# Patient Record
Sex: Female | Born: 1985
Health system: Southern US, Community
[De-identification: ages and names within clinical notes are randomized; demographics above are authoritative.]

## PROBLEM LIST (undated history)

## (undated) DIAGNOSIS — J45909 Unspecified asthma, uncomplicated: Secondary | ICD-10-CM

---
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
## 2003-10-31 ENCOUNTER — Emergency Department (HOSPITAL_COMMUNITY): Admission: EM | Admit: 2003-10-31 | Discharge: 2003-10-31 | Payer: Self-pay | Admitting: Emergency Medicine

## 2003-11-05 ENCOUNTER — Emergency Department (HOSPITAL_COMMUNITY): Admission: EM | Admit: 2003-11-05 | Discharge: 2003-11-05 | Payer: Self-pay | Admitting: Emergency Medicine

## 2005-05-03 ENCOUNTER — Emergency Department (HOSPITAL_COMMUNITY): Admission: EM | Admit: 2005-05-03 | Discharge: 2005-05-03 | Payer: Self-pay | Admitting: Family Medicine

## 2007-07-22 ENCOUNTER — Emergency Department (HOSPITAL_COMMUNITY): Admission: EM | Admit: 2007-07-22 | Discharge: 2007-07-22 | Payer: Self-pay | Admitting: Emergency Medicine

## 2007-09-22 ENCOUNTER — Emergency Department (HOSPITAL_COMMUNITY): Admission: EM | Admit: 2007-09-22 | Discharge: 2007-09-22 | Payer: Self-pay | Admitting: Emergency Medicine

## 2007-09-23 ENCOUNTER — Emergency Department (HOSPITAL_COMMUNITY): Admission: EM | Admit: 2007-09-23 | Discharge: 2007-09-23 | Payer: Self-pay | Admitting: Emergency Medicine

## 2008-11-19 ENCOUNTER — Emergency Department (HOSPITAL_COMMUNITY): Admission: EM | Admit: 2008-11-19 | Discharge: 2008-11-19 | Payer: Self-pay | Admitting: Emergency Medicine

## 2009-01-26 ENCOUNTER — Emergency Department (HOSPITAL_COMMUNITY): Admission: EM | Admit: 2009-01-26 | Discharge: 2009-01-26 | Payer: Self-pay | Admitting: Emergency Medicine

## 2010-05-02 LAB — POCT URINALYSIS DIP (DEVICE)
Glucose, UA: NEGATIVE mg/dL
Ketones, ur: NEGATIVE mg/dL
Nitrite: NEGATIVE
Protein, ur: 30 mg/dL — AB
Specific Gravity, Urine: 1.015 (ref 1.005–1.030)
Urobilinogen, UA: 1 mg/dL (ref 0.0–1.0)
pH: 5.5 (ref 5.0–8.0)

## 2010-05-02 LAB — GC/CHLAMYDIA PROBE AMP, GENITAL
Chlamydia, DNA Probe: NEGATIVE
GC Probe Amp, Genital: NEGATIVE

## 2010-05-02 LAB — HERPES SIMPLEX VIRUS CULTURE: Culture: NOT DETECTED

## 2010-05-02 LAB — WET PREP, GENITAL
Trich, Wet Prep: NONE SEEN
Yeast Wet Prep HPF POC: NONE SEEN

## 2010-05-02 LAB — POCT PREGNANCY, URINE: Preg Test, Ur: NEGATIVE

## 2010-05-05 LAB — URINALYSIS, ROUTINE W REFLEX MICROSCOPIC
Glucose, UA: NEGATIVE mg/dL
Nitrite: NEGATIVE
Protein, ur: NEGATIVE mg/dL
Urobilinogen, UA: 1 mg/dL (ref 0.0–1.0)

## 2010-05-05 LAB — PREGNANCY, URINE: Preg Test, Ur: NEGATIVE

## 2010-08-18 ENCOUNTER — Inpatient Hospital Stay (INDEPENDENT_AMBULATORY_CARE_PROVIDER_SITE_OTHER)
Admission: RE | Admit: 2010-08-18 | Discharge: 2010-08-18 | Disposition: A | Discharge status: Home / Self Care | Payer: PRIVATE HEALTH INSURANCE | Source: Ambulatory Visit | Attending: Emergency Medicine | Admitting: Emergency Medicine

## 2010-08-18 DIAGNOSIS — N12 Tubulo-interstitial nephritis, not specified as acute or chronic: Secondary | ICD-10-CM

## 2010-08-18 LAB — POCT URINALYSIS DIP (DEVICE)
Nitrite: POSITIVE — AB
Protein, ur: NEGATIVE mg/dL
Urobilinogen, UA: 0.2 mg/dL (ref 0.0–1.0)

## 2010-08-18 LAB — POCT PREGNANCY, URINE: Preg Test, Ur: NEGATIVE

## 2010-08-20 LAB — URINE CULTURE

## 2010-11-07 ENCOUNTER — Inpatient Hospital Stay (INDEPENDENT_AMBULATORY_CARE_PROVIDER_SITE_OTHER)
Admission: RE | Admit: 2010-11-07 | Discharge: 2010-11-07 | Disposition: A | Discharge status: Home / Self Care | Payer: PRIVATE HEALTH INSURANCE | Source: Ambulatory Visit | Attending: Family Medicine | Admitting: Family Medicine

## 2010-11-07 DIAGNOSIS — N390 Urinary tract infection, site not specified: Secondary | ICD-10-CM

## 2010-11-07 LAB — POCT URINALYSIS DIP (DEVICE)
Glucose, UA: NEGATIVE mg/dL
Ketones, ur: NEGATIVE mg/dL
Specific Gravity, Urine: 1.015 (ref 1.005–1.030)
Urobilinogen, UA: 0.2 mg/dL (ref 0.0–1.0)

## 2010-11-07 LAB — POCT PREGNANCY, URINE: Preg Test, Ur: NEGATIVE

## 2012-06-11 ENCOUNTER — Emergency Department (HOSPITAL_COMMUNITY)
Admission: EM | Admit: 2012-06-11 | Discharge: 2012-06-11 | Disposition: A | Discharge status: Home / Self Care | Payer: Commercial Managed Care - PPO | Attending: Emergency Medicine | Admitting: Emergency Medicine

## 2012-06-11 ENCOUNTER — Encounter (HOSPITAL_COMMUNITY): Payer: Self-pay | Admitting: *Deleted

## 2012-06-11 DIAGNOSIS — R0602 Shortness of breath: Secondary | ICD-10-CM | POA: Insufficient documentation

## 2012-06-11 DIAGNOSIS — Z79899 Other long term (current) drug therapy: Secondary | ICD-10-CM | POA: Insufficient documentation

## 2012-06-11 DIAGNOSIS — R05 Cough: Secondary | ICD-10-CM | POA: Insufficient documentation

## 2012-06-11 DIAGNOSIS — J45901 Unspecified asthma with (acute) exacerbation: Secondary | ICD-10-CM

## 2012-06-11 DIAGNOSIS — R059 Cough, unspecified: Secondary | ICD-10-CM | POA: Insufficient documentation

## 2012-06-11 DIAGNOSIS — R0789 Other chest pain: Secondary | ICD-10-CM | POA: Insufficient documentation

## 2012-06-11 HISTORY — DX: Unspecified asthma, uncomplicated: J45.909

## 2012-06-11 MED ORDER — ALBUTEROL SULFATE (5 MG/ML) 0.5% IN NEBU
5.0000 mg | INHALATION_SOLUTION | Freq: Once | RESPIRATORY_TRACT | Status: AC
  Administered 2012-06-11: 5 mg via RESPIRATORY_TRACT
  Filled 2012-06-11: qty 1

## 2012-06-11 NOTE — ED Notes (Signed)
Pt c/o SOB x's 1 week. Reports "my asthma is just been acting up, I've been using my albuterol too much." pt denies complaints of pain.

## 2012-06-11 NOTE — ED Provider Notes (Signed)
History    This chart was scribed for non-physician practitioner Johnnette Gourd working with Celene Kras, MD by Quintella Reichert, ED Scribe. This patient was seen in room WTR6/WTR6 and the patient's care was started at 6:18 PM .   CSN: [id number]  Arrival date & time 06/11/12  1729      Chief Complaint  Patient presents with  . Asthma     The history is provided by the patient. No language interpreter was used.    HPI Comments: Allison Crosby is a 27 y.o. female who presents to the Emergency Department complaining of gradual onset, gradually worsening, constant asthma exacerbation that began last week.  Pt states that her symptoms include chest tightness, wheezing, SOB, and cough.  She states that SOB occurs mainly at night, and describes cough as usually dry but intermittently phlegmatic today. Pt states that current symptoms feel like typical asthma exacerbation for her.  She has an albuterol inhaler at home and has been using it several times per day, which has not relieved symptoms. At baseline, she denies needing to use her inhaler daily. Pt has allergies and has not taken allergy medications.   Past Medical History  Diagnosis Date  . Asthma     History reviewed. No pertinent past surgical history.  History reviewed. No pertinent family history.  History  Substance Use Topics  . Smoking status: Not on file  . Smokeless tobacco: Not on file  . Alcohol Use: Yes    No OB history provided.  Review of Systems  Constitutional: Negative for fever and chills.  Respiratory: Positive for cough, chest tightness, shortness of breath and wheezing.   All other systems reviewed and are negative.    Allergies  Metronidazole  Home Medications   Current Outpatient Rx  Name  Route  Sig  Dispense  Refill  . albuterol (PROVENTIL HFA;VENTOLIN HFA) 108 (90 BASE) MCG/ACT inhaler   Inhalation   Inhale 2 puffs into the lungs every 6 (six) hours as needed for wheezing.          . Aspirin-Acetaminophen-Caffeine (GOODY HEADACHE PO)   Oral   Take 1 Package by mouth 2 (two) times daily as needed (pain, headache).           BP 147/73  Pulse 75  Temp(Src) 98.6 F (37 C) (Oral)  Resp 18  Ht 5\' 5"  (1.651 m)  Wt 270 lb (122.471 kg)  BMI 44.93 kg/m2  SpO2 98%  LMP 06/09/2012  Physical Exam  Nursing note and vitals reviewed. Constitutional: She is oriented to person, place, and time. She appears well-developed and well-nourished. No distress.  HENT:  Head: Normocephalic and atraumatic.  Congestion and mucosal edema  Eyes: Conjunctivae and EOM are normal.  Neck: Normal range of motion. Neck supple.  Cardiovascular: Normal rate, regular rhythm and normal heart sounds.   Pulmonary/Chest: Effort normal. No respiratory distress. She has wheezes. She has no rales.  Mild scattered inspiratory and expiratory wheezing. No rales or rhonchi.   Musculoskeletal: Normal range of motion. She exhibits no edema.  Neurological: She is alert and oriented to person, place, and time. No sensory deficit.  Skin: Skin is warm and dry.  Psychiatric: She has a normal mood and affect. Her behavior is normal.    ED Course  Procedures (including critical care time)  DIAGNOSTIC STUDIES: Oxygen Saturation is 98% on room air, normal by my interpretation.    COORDINATION OF CARE: 6:20 PM-Discussed treatment plan which includes breathing treatment with  pt at bedside and pt agreed to plan. Advised pt that her symptoms are not severe enough to warrant steroids at this time and pt agreed.    Labs Reviewed - No data to display No results found.   1. Asthma exacerbation       MDM  27 year old female with asthma exacerbation. Very mild wheezing on exam. I gave patient albuterol breathing treatment, and on reexamination her lungs are clear. Patient reports marked improvement and states she's feeling a lot better. I advised her to take an allergy pill daily. Resource guide given for  PCP followup. Return precautions discussed. Patient states understanding of plan and is agreeable.    I personally performed the services described in this documentation, which was scribed in my presence. The recorded information has been reviewed and is accurate.   Trevor Mace, PA-C 06/11/12 [phone]

## 2012-06-12 NOTE — ED Provider Notes (Signed)
Medical screening examination/treatment/procedure(s) were performed by non-physician practitioner and as supervising physician I was immediately available for consultation/collaboration.   Celene Kras, MD 06/12/12 [phone]

## 2012-06-16 ENCOUNTER — Emergency Department (HOSPITAL_COMMUNITY)
Admission: EM | Admit: 2012-06-16 | Discharge: 2012-06-16 | Disposition: A | Discharge status: Home / Self Care | Payer: Self-pay | Attending: Emergency Medicine | Admitting: Emergency Medicine

## 2012-06-16 ENCOUNTER — Emergency Department (HOSPITAL_COMMUNITY): Payer: Self-pay

## 2012-06-16 ENCOUNTER — Encounter (HOSPITAL_COMMUNITY): Payer: Self-pay | Admitting: *Deleted

## 2012-06-16 DIAGNOSIS — Z79899 Other long term (current) drug therapy: Secondary | ICD-10-CM | POA: Insufficient documentation

## 2012-06-16 DIAGNOSIS — R0609 Other forms of dyspnea: Secondary | ICD-10-CM | POA: Insufficient documentation

## 2012-06-16 DIAGNOSIS — J3489 Other specified disorders of nose and nasal sinuses: Secondary | ICD-10-CM | POA: Insufficient documentation

## 2012-06-16 DIAGNOSIS — J988 Other specified respiratory disorders: Secondary | ICD-10-CM

## 2012-06-16 DIAGNOSIS — R0600 Dyspnea, unspecified: Secondary | ICD-10-CM

## 2012-06-16 DIAGNOSIS — R0989 Other specified symptoms and signs involving the circulatory and respiratory systems: Secondary | ICD-10-CM | POA: Insufficient documentation

## 2012-06-16 LAB — BASIC METABOLIC PANEL
BUN: 10 mg/dL (ref 6–23)
CO2: 29 mEq/L (ref 19–32)
Calcium: 9 mg/dL (ref 8.4–10.5)
Chloride: 102 mEq/L (ref 96–112)
Creatinine, Ser: 0.76 mg/dL (ref 0.50–1.10)
GFR calc Af Amer: >90 mL/min (ref >90)
GFR calc non Af Amer: >90 mL/min (ref >90)
Glucose, Bld: 90 mg/dL (ref 70–99)
Potassium: 3.7 mEq/L (ref 3.5–5.1)
Sodium: 136 mEq/L (ref 135–145)

## 2012-06-16 LAB — CBC WITH DIFFERENTIAL/PLATELET
Basophils Absolute: 0 10*3/uL (ref 0.0–0.1)
Basophils Relative: 1 % (ref 0–1)
Eosinophils Absolute: 0.6 10*3/uL (ref 0.0–0.7)
Eosinophils Relative: 9 % — ABNORMAL HIGH (ref 0–5)
HCT: 37 % (ref 36.0–46.0)
Hemoglobin: 12.6 g/dL (ref 12.0–15.0)
Lymphocytes Relative: 31 % (ref 12–46)
Lymphs Abs: 2.1 10*3/uL (ref 0.7–4.0)
MCH: 28.4 pg (ref 26.0–34.0)
MCHC: 34.1 g/dL (ref 30.0–36.0)
MCV: 83.3 fL (ref 78.0–100.0)
Monocytes Absolute: 0.5 10*3/uL (ref 0.1–1.0)
Monocytes Relative: 7 % (ref 3–12)
Neutro Abs: 3.5 10*3/uL (ref 1.7–7.7)
Neutrophils Relative %: 52 % (ref 43–77)
Platelets: 387 10*3/uL (ref 150–400)
RBC: 4.44 MIL/uL (ref 3.87–5.11)
RDW: 12.6 % (ref 11.5–15.5)
WBC: 6.7 10*3/uL (ref 4.0–10.5)

## 2012-06-16 LAB — D-DIMER, QUANTITATIVE: D-Dimer, Quant: 0.37 ug/mL-FEU (ref 0.00–0.48)

## 2012-06-16 MED ORDER — FEXOFENADINE-PSEUDOEPHED ER 60-120 MG PO TB12: 1.0000 | ORAL_TABLET | Freq: Two times a day (BID) | ORAL | Status: DC

## 2012-06-16 NOTE — ED Notes (Signed)
MD at bedside. 

## 2012-06-16 NOTE — ED Notes (Signed)
Patient states that she was awakened by "not being able to breathe well about 3am.

## 2012-06-16 NOTE — ED Notes (Signed)
Pt from home with reports of shortness of breath that is worse at night. Pt reports being treated for same here on Tuesday. Pt endorses hx of asthma with increasing use of inhaler that does not resolve shortness of breath and cough.

## 2012-06-18 NOTE — ED Provider Notes (Signed)
History    27yF with sob. Onset almost a week ago. Worse at night. Fairly constant. using inhaler but doesn't seem to help much. Denies cp. No fever. Nonproductive cough. No usual leg pain or swelling. Seen in ED recently for same. Reports symptoms most unchanged.   CSN: [id number]  Arrival date & time 06/16/12  7829   First MD Initiated Contact with Patient 06/16/12 [phone]      Chief Complaint  Patient presents with  . Shortness of Breath    (Consider location/radiation/quality/duration/timing/severity/associated sxs/prior treatment) HPI  Past Medical History  Diagnosis Date  . Asthma     History reviewed. No pertinent past surgical history.  History reviewed. No pertinent family history.  History  Substance Use Topics  . Smoking status: Not on file  . Smokeless tobacco: Never Used  . Alcohol Use: Yes    OB History   Grav Para Term Preterm Abortions TAB SAB Ect Mult Living                  Review of Systems  All systems reviewed and negative, other than as noted in HPI.   Allergies  Metronidazole  Home Medications   Current Outpatient Rx  Name  Route  Sig  Dispense  Refill  . albuterol (PROVENTIL HFA;VENTOLIN HFA) 108 (90 BASE) MCG/ACT inhaler   Inhalation   Inhale 2 puffs into the lungs every 6 (six) hours as needed for wheezing.         . Aspirin-Acetaminophen-Caffeine (GOODY HEADACHE PO)   Oral   Take 1 Package by mouth 2 (two) times daily as needed (pain, headache).         . fexofenadine (ALLEGRA) 180 MG tablet   Oral   Take 180 mg by mouth daily.         Marland Kitchen levonorgestrel (MIRENA) 20 MCG/24HR IUD   Intrauterine   1 each by Intrauterine route once.         . fexofenadine-pseudoephedrine (ALLEGRA-D) 60-120 MG per tablet   Oral   Take 1 tablet by mouth every 12 (twelve) hours.   30 tablet   0     BP 120/74  Pulse 64  Temp(Src) 98.2 F (36.8 C) (Oral)  Resp 16  SpO2 98%  LMP 06/09/2012  Physical Exam  Nursing note and vitals  reviewed. Constitutional: She appears well-developed and well-nourished. No distress.  HENT:  Head: Normocephalic and atraumatic.  Eyes: Conjunctivae are normal. Right eye exhibits no discharge. Left eye exhibits no discharge.  Neck: Neck supple.  Cardiovascular: Normal rate, regular rhythm and normal heart sounds.  Exam reveals no gallop and no friction rub.   No murmur heard. Pulmonary/Chest: Effort normal and breath sounds normal. No respiratory distress.  Abdominal: Soft. She exhibits no distension. There is no tenderness.  Musculoskeletal: She exhibits no edema and no tenderness.  Lower extremities symmetric as compared to each other. No calf tenderness. Negative Homan's. No palpable cords.   Neurological: She is alert.  Skin: Skin is warm and dry.  Psychiatric: She has a normal mood and affect. Her behavior is normal. Thought content normal.    ED Course  Procedures (including critical care time)  Labs Reviewed  CBC WITH DIFFERENTIAL - Abnormal; Notable for the following:    Eosinophils Relative 9 (*)    All other components within normal limits  BASIC METABOLIC PANEL  D-DIMER, QUANTITATIVE   No results found.   1. Dyspnea   2. Congestion of upper airway  MDM  27yF with dyspnea and congestion.  Exam unremarkable. CXR clear. Mirena, otherwise low risk for PE. Dimer normal. Already taking allerga. Decongestant added. Return precautions discussed.         Raeford Razor, MD 06/18/12 [phone]

## 2012-09-09 ENCOUNTER — Encounter: Payer: Self-pay | Admitting: Obstetrics & Gynecology

## 2012-09-23 ENCOUNTER — Encounter: Payer: Self-pay | Admitting: *Deleted

## 2012-10-16 ENCOUNTER — Encounter: Payer: Self-pay | Admitting: Obstetrics & Gynecology

## 2012-10-26 ENCOUNTER — Emergency Department (INDEPENDENT_AMBULATORY_CARE_PROVIDER_SITE_OTHER)
Admission: EM | Admit: 2012-10-26 | Discharge: 2012-10-26 | Disposition: A | Discharge status: Home / Self Care | Payer: Self-pay | Source: Home / Self Care

## 2012-10-26 ENCOUNTER — Encounter (HOSPITAL_COMMUNITY): Payer: Self-pay | Admitting: Emergency Medicine

## 2012-10-26 ENCOUNTER — Other Ambulatory Visit (HOSPITAL_COMMUNITY)
Admission: RE | Admit: 2012-10-26 | Discharge: 2012-10-26 | Disposition: A | Discharge status: Home / Self Care | Payer: PRIVATE HEALTH INSURANCE | Source: Ambulatory Visit | Attending: Emergency Medicine | Admitting: Emergency Medicine

## 2012-10-26 DIAGNOSIS — N898 Other specified noninflammatory disorders of vagina: Secondary | ICD-10-CM

## 2012-10-26 DIAGNOSIS — N760 Acute vaginitis: Secondary | ICD-10-CM | POA: Insufficient documentation

## 2012-10-26 DIAGNOSIS — N899 Noninflammatory disorder of vagina, unspecified: Secondary | ICD-10-CM

## 2012-10-26 DIAGNOSIS — Z113 Encounter for screening for infections with a predominantly sexual mode of transmission: Secondary | ICD-10-CM | POA: Insufficient documentation

## 2012-10-26 LAB — POCT URINALYSIS DIP (DEVICE)
Bilirubin Urine: NEGATIVE
Glucose, UA: NEGATIVE mg/dL
Ketones, ur: NEGATIVE mg/dL
Leukocytes, UA: NEGATIVE
Protein, ur: NEGATIVE mg/dL
Specific Gravity, Urine: 1.02 (ref 1.005–1.030)

## 2012-10-26 LAB — POCT PREGNANCY, URINE: Preg Test, Ur: NEGATIVE

## 2012-10-26 NOTE — ED Provider Notes (Signed)
CSN: [id number]     Arrival date & time 10/26/12  0900 History   None    Chief Complaint  Patient presents with  . Vaginal Discharge   (Consider location/radiation/quality/duration/timing/severity/associated sxs/prior Treatment) HPI  Presenting to [facility] for vaginal discharge. Started Monday. Foul odor. No discomfort. White discharge. Has IUD. Sexually active but no new partners. Denies recent ABX. Deneis fevers, n/v/d/c lower abdominal pain, dysuria, dysparunia.   Pt concerned for BV. Has has ~2x this year.   Past Medical History  Diagnosis Date  . Asthma    History reviewed. No pertinent past surgical history. No family history on file. History  Substance Use Topics  . Smoking status: Never Smoker   . Smokeless tobacco: Never Used  . Alcohol Use: Yes   OB History   Grav Para Term Preterm Abortions TAB SAB Ect Mult Living                 Review of Systems  Constitutional: Negative for fever, activity change and appetite change.  Gastrointestinal: Negative for nausea, vomiting, abdominal pain, constipation and blood in stool.  Genitourinary: Negative for dysuria and dyspareunia.  All other systems reviewed and are negative.    Allergies  Metronidazole  Home Medications   Current Outpatient Rx  Name  Route  Sig  Dispense  Refill  . albuterol (PROVENTIL HFA;VENTOLIN HFA) 108 (90 BASE) MCG/ACT inhaler   Inhalation   Inhale 2 puffs into the lungs every 6 (six) hours as needed for wheezing.         . Aspirin-Acetaminophen-Caffeine (GOODY HEADACHE PO)   Oral   Take 1 Package by mouth 2 (two) times daily as needed (pain, headache).         . fexofenadine (ALLEGRA) 180 MG tablet   Oral   Take 180 mg by mouth daily.         . fexofenadine-pseudoephedrine (ALLEGRA-D) 60-120 MG per tablet   Oral   Take 1 tablet by mouth every 12 (twelve) hours.   30 tablet   0   . levonorgestrel (MIRENA) 20 MCG/24HR IUD   Intrauterine   1 each by Intrauterine route once.           BP 115/76  Pulse 70  Temp(Src) 98.4 F (36.9 C) (Oral)  Resp 70  SpO2 96%  LMP 10/03/2012 Physical Exam  Constitutional: She is oriented to person, place, and time. She appears well-developed and well-nourished. No distress.  HENT:  Head: Normocephalic.  Eyes: EOM are normal. Pupils are equal, round, and reactive to light.  Neck: Normal range of motion.  Pulmonary/Chest: Effort normal. No respiratory distress.  Abdominal: Soft. She exhibits no distension.  Genitourinary: Uterus normal. Vaginal discharge found.  Musculoskeletal: Normal range of motion. She exhibits no edema.  Neurological: She is alert and oriented to person, place, and time.  Skin: Skin is warm and dry. No rash noted. No erythema.  Psychiatric: She has a normal mood and affect. Her behavior is normal. Judgment and thought content normal.    ED Course  Procedures (including critical care time) Labs Review Labs Reviewed  POCT URINALYSIS DIP (DEVICE)  POCT PREGNANCY, URINE   Imaging Review No results found.  MDM   1. Vaginal discharge   2. Vaginal irritation    27yo F w/ vaginal discharge concerning for BV given recent h/o BV infections. Wet Prep Gc, Chl sent  - Motrin 600 prn - f/u lab results and will Rx as needed. Of note [facility] allergy so will  need. Clinda if + for BV  Shelly Flatten, MD Family Medicine PGY-3 10/26/2012, 9:35 AM      Ozella Rocks, MD 10/26/12 [phone]

## 2012-10-26 NOTE — ED Notes (Signed)
Pt c/o a white/pinkish vag d/c onset Monday Sxs include: foul odor Denies: abd pain, dysuria, hematuria, f/v/n/d She is alert w/no signs of acute distress.

## 2012-10-26 NOTE — ED Provider Notes (Signed)
Medical screening examination/treatment/procedure(s) were performed by a resident physician and as supervising physician I was immediately available for consultation/collaboration.  Leslee Home, M.D.  Reuben Likes, MD 10/26/12 1047

## 2012-10-29 MED ORDER — CLINDAMYCIN HCL 300 MG PO CAPS: 300.0000 mg | ORAL_CAPSULE | Freq: Two times a day (BID) | ORAL | Status: DC

## 2012-10-29 NOTE — Progress Notes (Signed)
BV noted on Wet prep. Called in Clinda 300mg  BID x7 days ([facility] allergy) Pt called to inform that Rx wqaiting at pharmacy.  Shelly Flatten, MD Family Medicine PGY-3 10/29/2012, 12:51 PM

## 2012-10-31 NOTE — ED Notes (Signed)
GC/Chlamydia neg., Affirm: Candida and Trich neg., Gardnerella pos.  Pt. adequately treated with Clindamycin. [name] 10/31/2012

## 2016-03-13 LAB — HM PAP SMEAR

## 2016-04-17 ENCOUNTER — Emergency Department (HOSPITAL_COMMUNITY): Payer: No Typology Code available for payment source

## 2016-04-17 ENCOUNTER — Emergency Department (HOSPITAL_COMMUNITY)
Admission: EM | Admit: 2016-04-17 | Discharge: 2016-04-17 | Disposition: A | Discharge status: Home / Self Care | Payer: No Typology Code available for payment source | Attending: Emergency Medicine | Admitting: Emergency Medicine

## 2016-04-17 ENCOUNTER — Encounter (HOSPITAL_COMMUNITY): Payer: Self-pay | Admitting: Emergency Medicine

## 2016-04-17 DIAGNOSIS — Y999 Unspecified external cause status: Secondary | ICD-10-CM | POA: Diagnosis not present

## 2016-04-17 DIAGNOSIS — Z7982 Long term (current) use of aspirin: Secondary | ICD-10-CM | POA: Diagnosis not present

## 2016-04-17 DIAGNOSIS — J45909 Unspecified asthma, uncomplicated: Secondary | ICD-10-CM | POA: Insufficient documentation

## 2016-04-17 DIAGNOSIS — Y939 Activity, unspecified: Secondary | ICD-10-CM | POA: Diagnosis not present

## 2016-04-17 DIAGNOSIS — Y92410 Unspecified street and highway as the place of occurrence of the external cause: Secondary | ICD-10-CM | POA: Diagnosis not present

## 2016-04-17 DIAGNOSIS — S161XXA Strain of muscle, fascia and tendon at neck level, initial encounter: Secondary | ICD-10-CM

## 2016-04-17 DIAGNOSIS — S199XXA Unspecified injury of neck, initial encounter: Secondary | ICD-10-CM | POA: Diagnosis present

## 2016-04-17 MED ORDER — CYCLOBENZAPRINE HCL 10 MG PO TABS: 10.0000 mg | ORAL_TABLET | Freq: Two times a day (BID) | ORAL | 0 refills | Status: DC | PRN
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
## 2016-04-17 NOTE — ED Provider Notes (Signed)
Sign out from Burna FortsJeff Hedges, PA-C  [facility]. Pending cervical x-ray, d/c patient home. Low suspicion for fracture per Trey PaulaJeff.   Cervical x-ray shows no evidence of fracture or subluxation along the cervical spine. Discharge patient home with Flexeril with follow-up to PCP. Patient vitals stable and discharged in satisfactory condition.   Emi Holeslexandra M Vanity Larsson, PA-C 04/17/16 2009    Doug SouSam Jacubowitz, MD 04/17/16 2350

## 2016-04-17 NOTE — Discharge Instructions (Signed)
Please read attached information. If you experience any new or worsening signs or symptoms please return to the emergency room for evaluation. Please follow-up with your primary care provider or specialist as discussed. Please use medication prescribed only as directed and discontinue taking if you have any concerning signs or symptoms.   °

## 2016-06-05 ENCOUNTER — Ambulatory Visit (HOSPITAL_COMMUNITY)
Admission: EM | Admit: 2016-06-05 | Discharge: 2016-06-05 | Disposition: A | Discharge status: Home / Self Care | Payer: PRIVATE HEALTH INSURANCE | Attending: Internal Medicine | Admitting: Internal Medicine

## 2016-06-05 ENCOUNTER — Encounter (HOSPITAL_COMMUNITY): Payer: Self-pay | Admitting: Emergency Medicine

## 2016-06-05 DIAGNOSIS — J4541 Moderate persistent asthma with (acute) exacerbation: Secondary | ICD-10-CM | POA: Diagnosis not present

## 2016-06-05 DIAGNOSIS — J45901 Unspecified asthma with (acute) exacerbation: Secondary | ICD-10-CM

## 2016-06-05 MED ORDER — FEXOFENADINE-PSEUDOEPHED ER 60-120 MG PO TB12
1.0000 | ORAL_TABLET | Freq: Two times a day (BID) | ORAL | 0 refills | Status: DC
Start: 2016-06-05 — End: 2016-08-21

## 2016-06-05 MED ORDER — PREDNISONE 10 MG PO TABS: ORAL_TABLET | ORAL | 0 refills | Status: DC

## 2016-06-05 NOTE — ED Provider Notes (Signed)
CSN: [id number]     Arrival date & time 06/05/16  1121 History   None    Chief Complaint  Patient presents with  . Shortness of Breath   (Consider location/radiation/quality/duration/timing/severity/associated sxs/prior Treatment) The history is provided by the patient. No language interpreter was used.  Shortness of Breath  Severity:  Moderate Onset quality:  Gradual Duration:  1 day Timing:  Constant Progression:  Unchanged Chronicity:  New Context: not activity   Relieved by:  Nothing Worsened by:  Nothing Ineffective treatments:  None tried Associated symptoms: no wheezing     Past Medical History:  Diagnosis Date  . Asthma    History reviewed. No pertinent surgical history. History reviewed. No pertinent family history. Social History  Substance Use Topics  . Smoking status: Never Smoker  . Smokeless tobacco: Never Used  . Alcohol use Yes   OB History    No data available     Review of Systems  Respiratory: Positive for shortness of breath. Negative for wheezing.   All other systems reviewed and are negative.   Allergies  Metronidazole  Home Medications   Prior to Admission medications   Medication Sig Start Date End Date Taking? Authorizing Provider  albuterol (PROVENTIL HFA;VENTOLIN HFA) 108 (90 BASE) MCG/ACT inhaler Inhale 2 puffs into the lungs every 6 (six) hours as needed for wheezing.   Yes [provider]  Aspirin-Acetaminophen-Caffeine (GOODY HEADACHE PO) Take 1 Package by mouth 2 (two) times daily as needed (pain, headache).    [provider]  clindamycin (CLEOCIN) 300 MG capsule Take 1 capsule (300 mg total) by mouth 2 (two) times daily. 10/29/12   Ozella RocksMerrell, David J, MD  cyclobenzaprine (FLEXERIL) 10 MG tablet Take 1 tablet (10 mg total) by mouth 2 (two) times daily as needed for muscle spasms. 04/17/16   Hedges, Tinnie GensJeffrey, PA-C  fexofenadine-pseudoephedrine (ALLEGRA-D) 60-120 MG 12 hr tablet Take 1 tablet by mouth every 12 (twelve)  hours. 06/05/16   Elson AreasSofia, Jalayiah Bibian K, PA-C  levonorgestrel (MIRENA) 20 MCG/24HR IUD 1 each by Intrauterine route once.    [provider]  predniSONE (DELTASONE) 10 MG tablet 6,5,4,3,2,1 taper 06/05/16   Elson AreasSofia, Zakyia Gagan K, PA-C   Meds Ordered and Administered this Visit  Medications - No data to display  BP 116/76 (BP Location: Right Arm)   Pulse 69   Temp 98.5 F (36.9 C) (Oral)   Resp 20   LMP 05/24/2016   SpO2 97%  No data found.   Physical Exam  Constitutional: She is oriented to person, place, and time. She appears well-developed and well-nourished.  HENT:  Head: Normocephalic.  Right Ear: External ear normal.  Left Ear: External ear normal.  Eyes: EOM are normal.  Neck: Normal range of motion.  Cardiovascular: Normal rate and regular rhythm.   Pulmonary/Chest: Effort normal. She has no wheezes.  Abdominal: She exhibits no distension.  Musculoskeletal: Normal range of motion.  Neurological: She is alert and oriented to person, place, and time.  Psychiatric: She has a normal mood and affect.  Nursing note and vitals reviewed.   Urgent Care Course     Procedures (including critical care time)  Labs Review Labs Reviewed - No data to display  Imaging Review No results found.   Visual Acuity Review  Right Eye Distance:   Left Eye Distance:   Bilateral Distance:    Right Eye Near:   Left Eye Near:    Bilateral Near:         MDM  1. Moderate persistent asthma with exacerbation    An After Visit Summary was printed and given to the patient. Meds ordered this encounter  Medications  . fexofenadine-pseudoephedrine (ALLEGRA-D) 60-120 MG 12 hr tablet    Sig: Take 1 tablet by mouth every 12 (twelve) hours.    Dispense:  30 tablet    Refill:  0    Order Specific Question:   Supervising Provider    Answer:   Eustace Moore [[id number]]  . predniSONE (DELTASONE) 10 MG tablet    Sig: 6,5,4,3,2,1 taper    Dispense:  21 tablet    Refill:  0    Order  Specific Question:   Supervising Provider    Answer:   Eustace Moore [[id number]]      Elson Areas, PA-C 06/05/16 1332

## 2016-06-05 NOTE — ED Triage Notes (Signed)
Pt here for SOB onset yest associated w/allergy sx... Hx of asthma  Using her Proventil w/temp relief.   A&O x4... NAD

## 2016-06-05 NOTE — Discharge Instructions (Signed)
Return if any problems.

## 2016-08-09 ENCOUNTER — Telehealth: Payer: Self-pay | Admitting: Family Medicine

## 2016-08-09 NOTE — Telephone Encounter (Signed)
Relation to WJ:XBJYpt:self Call back [phone]   Reason for call:  Patient scheduled new patient appointment for 08/21/16 and would like her annual pap conducted, please advise if annual pap can be conducted

## 2016-08-11 NOTE — Telephone Encounter (Signed)
[facility] @ [phone]) asking the pt to RTC regarding the message below.//AB/CMA

## 2016-08-16 NOTE — Telephone Encounter (Signed)
[facility] @ [phone]) asking the pt to RTC regarding the message below.//AB/CMA

## 2016-08-16 NOTE — Telephone Encounter (Signed)
Spoke with the pt and she stated that she was told she may not be able to get a CPE with Pap Smear at her first visit.  Informed the pt if she has no concerns and she need a CPE we should be able to do a CPE at her visit.  Pt stated that is why she scheduled the appt.  Asked the pt when she had her last pap smear and she stated that she believe it was the begin of the year.  Informed the pt that if that was right then she may not need another one.  She stated that she has a different insurance.   Informed her that the guidelines for pap smear have chanced and they are done every 3 years.  Pt stated that her last one was abnormal.  And that is why she asked to have one.  Asked the pt if she could get the results of her last pap smear and she stated that she will try, and she will have it faxed to us.//AB/CMA

## 2016-08-18 NOTE — Telephone Encounter (Addendum)
Medical Release faxed over to [facility] [address], [place], [place] [id number] [phone] and fax # [phone] Attention [employer].   Spoke with [employer] from medical records trying to expedite report and [employer] stated last pap was conducted February, 2018 and was not considered abnormal but it was dx "ASCUS Pap" inflammed due to her having bacterial vaginosis.  As per [employer] report cant be expedited and will fax over report once release is received on Monday.   Release faxed over, confirmation received and scanned in chart.

## 2016-08-21 ENCOUNTER — Encounter: Payer: Self-pay | Admitting: Family Medicine

## 2016-08-21 ENCOUNTER — Telehealth: Payer: Self-pay | Admitting: *Deleted

## 2016-08-21 ENCOUNTER — Ambulatory Visit (INDEPENDENT_AMBULATORY_CARE_PROVIDER_SITE_OTHER): Payer: 59 | Admitting: Family Medicine

## 2016-08-21 ENCOUNTER — Other Ambulatory Visit (HOSPITAL_COMMUNITY)
Admission: RE | Admit: 2016-08-21 | Discharge: 2016-08-21 | Disposition: A | Discharge status: Home / Self Care | Payer: 59 | Source: Ambulatory Visit | Attending: Family Medicine | Admitting: Family Medicine

## 2016-08-21 VITALS — BP 120/78 | HR 81 | Temp 98.3°F | Ht 65.0 in | Wt 300.0 lb

## 2016-08-21 DIAGNOSIS — Z113 Encounter for screening for infections with a predominantly sexual mode of transmission: Secondary | ICD-10-CM | POA: Diagnosis not present

## 2016-08-21 DIAGNOSIS — R87810 Cervical high risk human papillomavirus (HPV) DNA test positive: Secondary | ICD-10-CM

## 2016-08-21 DIAGNOSIS — Z0000 Encounter for general adult medical examination without abnormal findings: Secondary | ICD-10-CM

## 2016-08-21 DIAGNOSIS — R87610 Atypical squamous cells of undetermined significance on cytologic smear of cervix (ASC-US): Secondary | ICD-10-CM

## 2016-08-21 DIAGNOSIS — Z114 Encounter for screening for human immunodeficiency virus [HIV]: Secondary | ICD-10-CM

## 2016-08-21 DIAGNOSIS — Z23 Encounter for immunization: Secondary | ICD-10-CM | POA: Diagnosis not present

## 2016-08-21 LAB — HIV ANTIBODY (ROUTINE TESTING W REFLEX): HIV: NONREACTIVE

## 2016-08-21 NOTE — Telephone Encounter (Signed)
Pt was today for office visit with Dr. Wendling.//AB/CMA

## 2016-08-21 NOTE — Patient Instructions (Addendum)
Call Center for [facility] [place] at [phone] for an appointment.  They are located at [address], [address], [place], [place], [phone] (right across the hall from our office).  Consider lifting weights for the upper body to help with strength prevention of muscle loss as you continue to lose weight.   Great work with your weight loss. Keep it up!  Let us know if you need anything.

## 2016-08-21 NOTE — Progress Notes (Signed)
Chief Complaint  Patient presents with  . Establish Care     Well Woman Allison Crosby is here for a complete physical.   Her last physical was >1 year ago.  Current diet: in general, a "healthy" diet  . Current exercise: walking; [employer] Weight is stable (has intentionally lost weight) and she denies daytime fatigue. Patient's last menstrual period was 07/26/2016 (exact date). Follow with a Dentist? Yes.   Follow with an Optometrist? Yes.   Seatbelt? Yes  She also had a Pap smear done in February of this year. It came back ascus with a positive HPV, high risk. She was told that she needs colposcopy, however was wondering whether she actually does need this. She has not set up with a GYN in this area.  Health Maintenance Pap/HPV- Yes Tetanus- No  Past Medical History:  Diagnosis Date  . Asthma     History reviewed. No pertinent surgical history. Medications  Current Outpatient Prescriptions on File Prior to Visit  Medication Sig Dispense Refill  . albuterol (PROVENTIL HFA;VENTOLIN HFA) 108 (90 BASE) MCG/ACT inhaler Inhale 2 puffs into the lungs every 6 (six) hours as needed for wheezing.      Allergies Allergies  Allergen Reactions  . Metronidazole Rash    Review of Systems: Constitutional:  no unexpected change in weight, no weakness, no unexplained fevers, sweats, or chills Eye:  no recent significant change in vision Ear/Nose/Mouth/Throat:  Ears:  no tinnitus or vertigo and no recent change in hearing, Nose/Mouth/Throat:  no complaints of nasal congestion or discharge, no sore throat and no recent change in voice or hoarseness Cardiovascular:  no exercise intolerance, no chest pain, no palpitations Respiratory:  no chronic cough, sputum, or hemoptysis and no shortness of breath Gastrointestinal:  no abdominal pain, no change in bowel habits, no significant change in appetite, no nausea, vomiting, diarrhea, or constipation and no black or bloody stool GU:   Female: negative for dysuria, frequency, and incontinence, Normal menses; no abnormal bleeding, pelvic pain, or discharge Musculoskeletal/Extremities:  no pain, redness, or swelling of the joints Integumentary (Skin/Breast):  no abnormal skin lesions reported, no new breast lumps or masses Neurologic:  no chronic headaches, no numbness, tingling, or tremor Psychiatric:  no anxiety, no depression Endocrine:  denies fatigue, weight changes, heat/cold intolerance, bowel or skin changes, or cardiovascular system symptoms Hematologic/Lymphatic:  no abnormal bleeding, no HIV risk factors, no night sweats, no swollen nodes, no weight loss Allergic/Immunologic:  no history of food or environmental allergies  Exam BP 120/78 (BP Location: Left Arm, Patient Position: Sitting, Cuff Size: Large)   Pulse 81   Temp 98.3 F (36.8 C) (Oral)   Ht 5\' 5"  (1.651 m)   Wt 300 lb (136.1 kg)   LMP 07/26/2016 (Exact Date)   SpO2 98%   BMI 49.92 kg/m  General:  well developed, well nourished, in no apparent distress Skin:  no significant moles, warts, or growths Head:  no masses, lesions, or tenderness Eyes:  pupils equal and round, sclera anicteric without injection Ears:  canals without lesions, TMs shiny without retraction, no obvious effusion, no erythema Nose:  nares patent, septum midline, mucosa normal, and no drainage or sinus tenderness Throat/Pharynx:  lips and gingiva without lesion; tongue and uvula midline; non-inflamed pharynx; no exudates or postnasal drainage Neck: neck supple without adenopathy, thyromegaly, or masses Thorax:  nontender Lungs:  clear to auscultation, breath sounds equal bilaterally, no respiratory distress Cardio:  regular rate and rhythm without murmurs, heart  sounds without clicks or rubs, point of maximal impulse normal; no lifts, heaves, or thrills Abdomen:  abdomen soft, nontender; bowel sounds normal; no masses or organomegaly Genital: not done Musculoskeletal:   symmetrical muscle groups noted without atrophy or deformity Extremities:  no clubbing, cyanosis, or edema, no deformities, no skin discoloration Neuro:  gait normal; deep tendon reflexes normal and symmetric Psych: well oriented with normal range of affect and appropriate judgment/insight  Assessment and Plan  Well woman exam (no gynecological exam)  ASCUS with positive high risk HPV cervical  Screening for HIV (human immunodeficiency virus) - Plan: HIV antibody  Routine screening for STI (sexually transmitted infection) - Plan: Urine cytology ancillary only  Need for Tdap vaccination - Plan: Tdap vaccine greater than or equal to 7yo IM  Need for pneumococcal vaccination - Plan: Pneumococcal polysaccharide vaccine 23-valent greater than or equal to 2yo subcutaneous/IM   Well 31 y.o. female. Counseled on diet and exercise. Challenged her to lift weights. She is doing a good job with her weight loss endeavors. Other orders as above. Gave number for GYN across hall, I do agree that she needs a colposcopy given her high risk HPV test result.  Follow up in 1 yr pending above. The patient voiced understanding and agreement to the plan.  Jilda Rocheicholas Paul MadisonWendling, DO 08/21/16 10:00 AM

## 2016-08-21 NOTE — Telephone Encounter (Signed)
Received [facility] [place], [place]; forwarded to provider/SLS 07/23

## 2016-08-22 LAB — URINE CYTOLOGY ANCILLARY ONLY
Chlamydia: NEGATIVE
NEISSERIA GONORRHEA: NEGATIVE
Trichomonas: NEGATIVE

## 2017-01-01 ENCOUNTER — Encounter: Payer: Self-pay | Admitting: Family Medicine

## 2017-01-01 ENCOUNTER — Ambulatory Visit (INDEPENDENT_AMBULATORY_CARE_PROVIDER_SITE_OTHER): Payer: 59 | Admitting: Family Medicine

## 2017-01-01 VITALS — BP 118/82 | HR 74 | Temp 98.5°F | Ht 65.0 in | Wt 316.0 lb

## 2017-01-01 DIAGNOSIS — N3000 Acute cystitis without hematuria: Secondary | ICD-10-CM | POA: Diagnosis not present

## 2017-01-01 LAB — POC URINALSYSI DIPSTICK (AUTOMATED)
BILIRUBIN UA: NEGATIVE
Blood, UA: NEGATIVE
Glucose, UA: NEGATIVE
Ketones, UA: NEGATIVE
LEUKOCYTES UA: NEGATIVE
NITRITE UA: NEGATIVE
Protein, UA: NEGATIVE
Spec Grav, UA: 1.015 (ref 1.010–1.025)
Urobilinogen, UA: 0.2 E.U./dL
pH, UA: 6 (ref 5.0–8.0)

## 2017-01-01 MED ORDER — FLUCONAZOLE 150 MG PO TABS: 150.0000 mg | ORAL_TABLET | Freq: Once | ORAL | 0 refills | Status: AC

## 2017-01-01 MED ORDER — SULFAMETHOXAZOLE-TRIMETHOPRIM 800-160 MG PO TABS: 1.0000 | ORAL_TABLET | Freq: Two times a day (BID) | ORAL | 0 refills | Status: DC

## 2017-01-01 NOTE — Addendum Note (Signed)
Addended by: Scharlene GlossEWING, Kyann Heydt B on: 01/01/2017 02:37 PM   Modules accepted: Orders

## 2017-01-01 NOTE — Patient Instructions (Signed)
I will let you know the results of your culture in the next 2 days.  Let us know if you need anything.

## 2017-01-01 NOTE — Progress Notes (Signed)
Pre visit review using our clinic review tool, if applicable. No additional management support is needed unless otherwise documented below in the visit note. 

## 2017-01-01 NOTE — Progress Notes (Signed)
Chief Complaint  Patient presents with  . Urinary Frequency    urine odor    Allison Crosby is a 31 y.o. female here for possible UTI.  Duration: 10 days. Symptoms: urinary frequency, foul odor, urinary retention Denies: urinary hesitancy, bleeding, fever, nausea and vomiting, vaginal discharge Hx of recurrent UTI? No Denies new sexual partners.  ROS:  Constitutional: denies fever GU: As noted in HPI MSK: Denies back pain Abd: Denies constipation or abdominal pain  Past Medical History:  Diagnosis Date  . Asthma     BP 118/82 (BP Location: Left Arm, Patient Position: Sitting, Cuff Size: Large)   Pulse 74   Temp 98.5 F (36.9 C) (Oral)   Ht 5\' 5"  (1.651 m)   Wt (!) 316 lb (143.3 kg)   SpO2 97%   BMI 52.59 kg/m  General: Awake, alert, appears stated age HEENT: MMM Heart: RRR, no murmurs Lungs: CTAB, normal respiratory effort, no accessory muscle usage Abd: BS+, soft, NT, ND, no masses or organomegaly MSK: No CVA tenderness, neg Lloyd's sign Psych: Age appropriate judgment and insight  Acute cystitis without hematuria - Plan: sulfamethoxazole-trimethoprim (BACTRIM DS,SEPTRA DS) 800-160 MG tablet, fluconazole (DIFLUCAN) 150 MG tablet  UA neg. Given s/s's, will tx and await culture results.  F/u prn. The patient voiced understanding and agreement to the plan.  Jilda Rocheicholas Paul [place], DO 01/01/17 1:51 PM

## 2017-01-03 LAB — URINE CULTURE
MICRO NUMBER: 81355107
SPECIMEN QUALITY:: ADEQUATE

## 2017-02-12 ENCOUNTER — Encounter: Payer: 59 | Admitting: Obstetrics and Gynecology

## 2017-03-02 ENCOUNTER — Encounter: Payer: Self-pay | Admitting: Family Medicine

## 2017-03-02 ENCOUNTER — Ambulatory Visit (INDEPENDENT_AMBULATORY_CARE_PROVIDER_SITE_OTHER): Payer: 59 | Admitting: Family Medicine

## 2017-03-02 VITALS — BP 124/66 | HR 68 | Ht 65.0 in | Wt 313.0 lb

## 2017-03-02 DIAGNOSIS — Z6841 Body Mass Index (BMI) 40.0 and over, adult: Secondary | ICD-10-CM

## 2017-03-02 DIAGNOSIS — Z113 Encounter for screening for infections with a predominantly sexual mode of transmission: Secondary | ICD-10-CM

## 2017-03-02 DIAGNOSIS — Z1151 Encounter for screening for human papillomavirus (HPV): Secondary | ICD-10-CM

## 2017-03-02 DIAGNOSIS — Z124 Encounter for screening for malignant neoplasm of cervix: Secondary | ICD-10-CM

## 2017-03-02 DIAGNOSIS — Z01419 Encounter for gynecological examination (general) (routine) without abnormal findings: Secondary | ICD-10-CM | POA: Diagnosis not present

## 2017-03-02 DIAGNOSIS — Z1389 Encounter for screening for other disorder: Secondary | ICD-10-CM

## 2017-03-02 LAB — POCT URINALYSIS DIPSTICK
BILIRUBIN UA: NEGATIVE
GLUCOSE UA: NEGATIVE
Ketones, UA: NEGATIVE
Leukocytes, UA: NEGATIVE
Nitrite, UA: NEGATIVE
Spec Grav, UA: 1.015 (ref 1.010–1.025)
Urobilinogen, UA: 0.2 E.U./dL
pH, UA: 6.5 (ref 5.0–8.0)

## 2017-03-02 NOTE — Patient Instructions (Signed)

## 2017-03-02 NOTE — Progress Notes (Signed)
GYNECOLOGY ANNUAL PREVENTATIVE CARE ENCOUNTER NOTE  Subjective:   Allison Crosby is a 32 y.o. G0P0000 female here for a routine annual gynecologic exam.  Current complaints: none.   Denies abnormal vaginal bleeding, discharge, pelvic pain, problems with intercourse or other gynecologic concerns.    Gynecologic History Patient's last menstrual period was 02/09/2017. Patient is sexually active  Contraception: condoms Last Pap: uncertain. Results were: normal Last mammogram: n/a  Obstetric History OB History  Gravida Para Term Preterm AB Living  0 0 0 0 0 0  SAB TAB Ectopic Multiple Live Births  0 0 0 0 0        Past Medical History:  Diagnosis Date  . Asthma     History reviewed. No pertinent surgical history.  Current Outpatient Medications on File Prior to Visit  Medication Sig Dispense Refill  . albuterol (PROVENTIL HFA;VENTOLIN HFA) 108 (90 BASE) MCG/ACT inhaler Inhale 2 puffs into the lungs every 6 (six) hours as needed for wheezing.     No current facility-administered medications on file prior to visit.     Allergies  Allergen Reactions  . Metronidazole Rash    Social History   Socioeconomic History  . Marital status: Single    Spouse name: Not on file  . Number of children: Not on file  . Years of education: Not on file  . Highest education level: Not on file  Social Needs  . Financial resource strain: Not on file  . Food insecurity - worry: Not on file  . Food insecurity - inability: Not on file  . Transportation needs - medical: Not on file  . Transportation needs - non-medical: Not on file  Occupational History  . Not on file  Tobacco Use  . Smoking status: Never Smoker  . Smokeless tobacco: Never Used  Substance and Sexual Activity  . Alcohol use: Yes    Comment: Social  . Drug use: No  . Sexual activity: Yes    Birth control/protection: None  Other Topics Concern  . Not on file  Social History Narrative  . Not on file     Family History  Problem Relation Age of Onset  . Mental illness Maternal Aunt   . Arthritis Maternal Grandfather   . Cancer Maternal Grandfather        Lung    The following portions of the patient's history were reviewed and updated as appropriate: allergies, current medications, past family history, past medical history, past social history, past surgical history and problem list.  Review of Systems Pertinent items noted in HPI and remainder of comprehensive ROS otherwise negative.   Objective:  BP 124/66   Pulse 68   Ht 5\' 5"  (1.651 m)   Wt (!) 313 lb (142 kg)   LMP 02/09/2017   BMI 52.09 kg/m  CONSTITUTIONAL: Well-developed, well-nourished female in no acute distress.  HENT:  Normocephalic, atraumatic, External right and left ear normal. Oropharynx is clear and moist EYES: Conjunctivae and EOM are normal. Pupils are equal, round, and reactive to light. No scleral icterus.  NECK: Normal range of motion, supple, no masses.  Normal thyroid.   CARDIOVASCULAR: Normal heart rate noted, regular rhythm RESPIRATORY: Clear to auscultation bilaterally. Effort and breath sounds normal, no problems with respiration noted. BREASTS: Symmetric in size. No masses, skin changes, nipple drainage, or lymphadenopathy. ABDOMEN: Soft, normal bowel sounds, no distention noted.  No tenderness, rebound or guarding.  PELVIC: Normal appearing external genitalia; normal appearing vaginal mucosa and cervix.  No abnormal discharge noted.  Pap smear obtained.  Normal uterine size, no other palpable masses, no uterine or adnexal tenderness. MUSCULOSKELETAL: Normal range of motion. No tenderness.  No cyanosis, clubbing, or edema.  2+ distal pulses. SKIN: Skin is warm and dry. No rash noted. Not diaphoretic. No erythema. No pallor. NEUROLOGIC: Alert and oriented to person, place, and time. Normal reflexes, muscle tone coordination. No cranial nerve deficit noted. PSYCHIATRIC: Normal mood and affect. Normal  behavior. Normal judgment and thought content.  Assessment:  Annual gynecologic examination with pap smear   Plan:  1. Encounter for well woman exam with routine gynecological exam Will follow up results of pap smear and manage accordingly. Mammogram scheduled STD testing discussed. Patient requested testing: HIV, HepB/C, RPR, GC/CT Discussed exercise and diet Calcium and Vitamin D supplementation discussed - Cytology - PAP - HIV antibody - Hepatitis B surface antigen - RPR - Hepatitis C antibody - POCT urinalysis dipstick - Urine Culture  2. Routine screening for STI (sexually transmitted infection) - HIV antibody - Hepatitis B surface antigen - RPR - Hepatitis C antibody  3. Morbid Obesity Discussed diet, exercise. Patient has been on medication in the past for this. Had success. Information given about Healthy Weight and Wellness  Routine preventative health maintenance measures emphasized. Please refer to After Visit Summary for other counseling recommendations.    Candelaria CelesteJacob Kaesen Rodriguez, DO [facility]

## 2017-03-03 ENCOUNTER — Encounter (INDEPENDENT_AMBULATORY_CARE_PROVIDER_SITE_OTHER): Payer: Self-pay

## 2017-03-03 LAB — HEPATITIS C ANTIBODY: Hep C Virus Ab: <0.1 s/co ratio (ref 0.0–0.9)

## 2017-03-03 LAB — HIV ANTIBODY (ROUTINE TESTING W REFLEX): HIV SCREEN 4TH GENERATION: NONREACTIVE

## 2017-03-03 LAB — RPR: RPR Ser Ql: NONREACTIVE

## 2017-03-03 LAB — HEPATITIS B SURFACE ANTIGEN: HEP B S AG: NEGATIVE

## 2017-03-04 LAB — URINE CULTURE

## 2017-03-07 LAB — CYTOLOGY - PAP
Adequacy: ABSENT
Chlamydia: NEGATIVE
Diagnosis: NEGATIVE
HPV: DETECTED — AB
NEISSERIA GONORRHEA: NEGATIVE

## 2017-03-08 ENCOUNTER — Encounter: Payer: Self-pay | Admitting: Family Medicine

## 2017-03-08 DIAGNOSIS — R87618 Other abnormal cytological findings on specimens from cervix uteri: Secondary | ICD-10-CM | POA: Insufficient documentation

## 2017-03-08 DIAGNOSIS — R8789 Other abnormal findings in specimens from female genital organs: Secondary | ICD-10-CM | POA: Insufficient documentation

## 2017-03-13 MED ORDER — CLINDAMYCIN HCL 300 MG PO CAPS: 300.0000 mg | ORAL_CAPSULE | Freq: Two times a day (BID) | ORAL | 0 refills | Status: DC

## 2017-04-20 ENCOUNTER — Encounter: Payer: Self-pay | Admitting: Family Medicine

## 2017-04-20 ENCOUNTER — Ambulatory Visit (INDEPENDENT_AMBULATORY_CARE_PROVIDER_SITE_OTHER): Payer: 59 | Admitting: Family Medicine

## 2017-04-20 VITALS — BP 115/76 | HR 64 | Wt 313.0 lb

## 2017-04-20 DIAGNOSIS — Z30011 Encounter for initial prescription of contraceptive pills: Secondary | ICD-10-CM | POA: Diagnosis not present

## 2017-04-20 MED ORDER — LO LOESTRIN FE 1 MG-10 MCG / 10 MCG PO TABS: 1.0000 | ORAL_TABLET | Freq: Every day | ORAL | 3 refills | Status: DC

## 2017-04-20 NOTE — Progress Notes (Signed)
   Subjective:    Patient ID: Allison Crosby, female    DOB: 05/03/1985, 32 y.o.   MRN: [id number]  HPI Patient presented for contraception management initiation. She has used OCPs (Sprintec), mirena IUD, nuvaring. She had no particular complications with any of those methods of contraception. She is interested in the Nexplanon.  Denies family history and personal history of DVT/VTE. She is not on any medications.   Review of Systems     Objective:   Physical Exam  Constitutional: She appears well-developed and well-nourished.  Cardiovascular: Normal rate, regular rhythm and normal heart sounds.  Pulmonary/Chest: Effort normal and breath sounds normal.  Skin: Skin is warm.  Psychiatric: Her behavior is normal. Thought content normal.      Assessment & Plan:  1. Encounter for oral contraception initial prescription 15 minutes spent counseling patient regarding pros and cons with different contraception methods for OCPs, POPs, nexplanon, IUD. Patient would like to start with OCPs, using minimal hormone. Will start with Lo loestrin. Discussed went to start, possible VTE with estrogen forms of contraception. F/u as needed

## 2017-05-04 ENCOUNTER — Encounter: Payer: Self-pay | Admitting: Family Medicine

## 2017-05-23 ENCOUNTER — Encounter: Payer: Self-pay | Admitting: Family Medicine

## 2017-05-23 NOTE — Telephone Encounter (Signed)
Schedule appt please, OK to use doc of day slots. TY.

## 2017-05-24 ENCOUNTER — Ambulatory Visit (INDEPENDENT_AMBULATORY_CARE_PROVIDER_SITE_OTHER): Payer: 59 | Admitting: Family Medicine

## 2017-05-24 ENCOUNTER — Encounter: Payer: Self-pay | Admitting: Family Medicine

## 2017-05-24 VITALS — BP 122/70 | HR 73 | Temp 98.8°F | Ht 65.0 in | Wt 304.5 lb

## 2017-05-24 DIAGNOSIS — N3000 Acute cystitis without hematuria: Secondary | ICD-10-CM | POA: Diagnosis not present

## 2017-05-24 DIAGNOSIS — J302 Other seasonal allergic rhinitis: Secondary | ICD-10-CM

## 2017-05-24 LAB — POC URINALSYSI DIPSTICK (AUTOMATED)
BILIRUBIN UA: NEGATIVE
Blood, UA: NEGATIVE
Glucose, UA: NEGATIVE
KETONES UA: NEGATIVE
LEUKOCYTES UA: NEGATIVE
Nitrite, UA: NEGATIVE
Protein, UA: NEGATIVE
Spec Grav, UA: 1.015 (ref 1.010–1.025)
Urobilinogen, UA: 0.2 E.U./dL
pH, UA: 6 (ref 5.0–8.0)

## 2017-05-24 MED ORDER — FLUCONAZOLE 150 MG PO TABS: 150.0000 mg | ORAL_TABLET | Freq: Once | ORAL | 0 refills | Status: AC

## 2017-05-24 MED ORDER — LEVOCETIRIZINE DIHYDROCHLORIDE 5 MG PO TABS: 5.0000 mg | ORAL_TABLET | Freq: Every evening | ORAL | 2 refills | Status: DC

## 2017-05-24 MED ORDER — SULFAMETHOXAZOLE-TRIMETHOPRIM 800-160 MG PO TABS: 1.0000 | ORAL_TABLET | Freq: Two times a day (BID) | ORAL | 0 refills | Status: DC

## 2017-05-24 NOTE — Patient Instructions (Signed)
Claritin (loratadine), Allegra (fexofenadine), Zyrtec (cetirizine); these are listed in order from weakest to strongest. Generic, and therefore cheaper, options are in the parentheses.   Flonase (fluticasone); nasal spray that is over the counter. 2 sprays each nostril, once daily. Aim towards the same side eye when you spray.  There are available OTC, and the generic versions, which may be cheaper, are in parentheses. Show this to a pharmacist if you have trouble finding any of these items.  If anything changes with the urine, let us know. We will reach out when the culture comes back on Saturday.

## 2017-05-24 NOTE — Progress Notes (Signed)
Pre visit review using our clinic review tool, if applicable. No additional management support is needed unless otherwise documented below in the visit note. 

## 2017-05-24 NOTE — Progress Notes (Signed)
Chief Complaint  Patient presents with  . Dysuria  . Allergies    Allison Crosby David is a 32 y.o. female here for possible UTI.  Duration: 2 weeks. Symptoms: dysuria, odor Denies: urinary frequency, hematuria, urinary hesitancy, urinary retention, fever and flank pain, vaginal discharge Hx of recurrent UTI? No Denies new sexual partners  She has a history of allergies and is not currently taking anything.  She also has a history of asthma and allergen surface of trigger.  She has been using her albuterol inhaler more often because of this.  She is also been avoiding walking outside.  ROS:  Constitutional: denies fever GU: As noted in HPI  Past Medical History:  Diagnosis Date  . Asthma     BP 122/70 (BP Location: Left Arm, Patient Position: Sitting, Cuff Size: Large)   Pulse 73   Temp 98.8 F (37.1 C) (Oral)   Ht 5\' 5"  (1.651 m)   Wt (!) 304 lb 8 oz (138.1 kg)   SpO2 97%   BMI 50.67 kg/m  General: Awake, alert, appears stated age HEENT: MMM Heart: RRR, no murmurs Lungs: CTAB, normal respiratory effort, no accessory muscle usage Abd: BS+, soft, NT, ND, no masses or organomegaly MSK: No CVA tenderness, neg Lloyd's sign Psych: Age appropriate judgment and insight  Acute cystitis without hematuria - Plan: sulfamethoxazole-trimethoprim (BACTRIM DS,SEPTRA DS) 800-160 MG tablet, POCT Urinalysis Dipstick (Automated), Urine Culture, fluconazole (DIFLUCAN) 150 MG tablet  Seasonal allergies - Plan: levocetirizine (XYZAL) 5 MG tablet  Orders as above.  Given her history of normal UAs with positive urine cultures, will start empiric treatment while sending for culture.  Diflucan called in should she have a yeast infection due to antibiotic use.  Continue to push fluids. OTC allergy meds written down. Get whichever is cheaper b/t otc optionsa and rx. F/u prn. The patient voiced understanding and agreement to the plan.  Jilda Rocheicholas Paul [place], DO 05/24/17 3:01 PM

## 2017-05-27 LAB — URINE CULTURE
MICRO NUMBER:: 90506909
SPECIMEN QUALITY:: ADEQUATE

## 2017-07-08 ENCOUNTER — Encounter (HOSPITAL_BASED_OUTPATIENT_CLINIC_OR_DEPARTMENT_OTHER): Payer: Self-pay | Admitting: Emergency Medicine

## 2017-07-08 ENCOUNTER — Emergency Department (HOSPITAL_BASED_OUTPATIENT_CLINIC_OR_DEPARTMENT_OTHER)
Admission: EM | Admit: 2017-07-08 | Discharge: 2017-07-08 | Disposition: A | Discharge status: Home / Self Care | Payer: 59 | Attending: Emergency Medicine | Admitting: Emergency Medicine

## 2017-07-08 ENCOUNTER — Other Ambulatory Visit: Payer: Self-pay

## 2017-07-08 DIAGNOSIS — J45909 Unspecified asthma, uncomplicated: Secondary | ICD-10-CM | POA: Insufficient documentation

## 2017-07-08 DIAGNOSIS — K0889 Other specified disorders of teeth and supporting structures: Secondary | ICD-10-CM | POA: Insufficient documentation

## 2017-07-08 DIAGNOSIS — R51 Headache: Secondary | ICD-10-CM | POA: Diagnosis present

## 2017-07-08 DIAGNOSIS — Z79899 Other long term (current) drug therapy: Secondary | ICD-10-CM | POA: Insufficient documentation

## 2017-07-08 DIAGNOSIS — J3489 Other specified disorders of nose and nasal sinuses: Secondary | ICD-10-CM

## 2017-07-08 MED ORDER — FLUCONAZOLE 150 MG PO TABS: 150.0000 mg | ORAL_TABLET | Freq: Every day | ORAL | 0 refills | Status: DC

## 2017-07-08 MED ORDER — AMOXICILLIN-POT CLAVULANATE 875-125 MG PO TABS
1.0000 | ORAL_TABLET | Freq: Once | ORAL | Status: AC
  Administered 2017-07-08: 1 via ORAL
  Filled 2017-07-08: qty 1

## 2017-07-08 MED ORDER — AMOXICILLIN-POT CLAVULANATE 875-125 MG PO TABS: 1.0000 | ORAL_TABLET | Freq: Two times a day (BID) | ORAL | 0 refills | Status: DC

## 2017-07-08 NOTE — ED Notes (Signed)
ED Provider at bedside. 

## 2017-07-08 NOTE — Discharge Instructions (Signed)
Please read and follow all provided instructions.  Your diagnoses today include:  1. Sinus pain   2. Pain, dental     The exam and treatment you received today has been provided on an emergency basis only. This is not a substitute for complete medical or dental care.  Tests performed today include:  Vital signs. See below for your results today.   Medications prescribed:   Augmentin - antibiotic  You have been prescribed an antibiotic medicine: take the entire course of medicine even if you are feeling better. Stopping early can cause the antibiotic not to work.  Take any prescribed medications only as directed.  Home care instructions:  Follow any educational materials contained in this packet.  Follow-up instructions: Please follow-up with your dentist for further evaluation of your symptoms.   Return instructions:   Please return to the Emergency Department if you experience worsening symptoms.  Please return if you develop a fever, you develop more swelling in your face or neck, you have trouble breathing or swallowing food.  Please return if you have any other emergent concerns.  Additional Information:  Your vital signs today were: BP (!) 145/89 (BP Location: Left Arm)    Pulse 96    Temp 99.3 F (37.4 C) (Oral)    Resp 18    Ht 5\' 5"  (1.651 m)    Wt (!) 140.6 kg (310 lb)    LMP 07/08/2017    SpO2 99%    BMI 51.59 kg/m  If your blood pressure (BP) was elevated above 135/85 this visit, please have this repeated by your doctor within one month. --------------

## 2017-07-08 NOTE — ED Triage Notes (Signed)
Patient states that she had a tooth pulled recently and now she has pressure to her right right facial sinus

## 2017-07-08 NOTE — ED Provider Notes (Signed)
[facility] EMERGENCY DEPARTMENT Provider Note   CSN: [id number] Arrival date & time: 07/08/17  1635     History   Chief Complaint Chief Complaint  Patient presents with  . Facial Pain    HPI Allison Crosby is a 32 y.o. female.  Patient presents to the emergency department with complaint of right-sided facial pain and sinus tenderness, green discharge when she blows her nose starting right after having dental extraction on the same side 4 days ago.  She denies fevers, nausea or vomiting.  No ear pain.  No facial swelling.  She has tried ibuprofen, Tylenol #3, over-the-counter decongestants without improvement.  Due for follow-up in 2 weeks.     Past Medical History:  Diagnosis Date  . Asthma     Patient Active Problem List   Diagnosis Date Noted  . Pap smear abnormality of cervix/human papillomavirus (HPV) positive 03/08/2017  . Morbid obesity with BMI of 50.0-59.9, adult (HCC) 03/02/2017    History reviewed. No pertinent surgical history.   OB History    Gravida  0   Para  0   Term  0   Preterm  0   AB  0   Living  0     SAB  0   TAB  0   Ectopic  0   Multiple  0   Live Births  0            Home Medications    Prior to Admission medications   Medication Sig Start Date End Date Taking? Authorizing Provider  albuterol (PROVENTIL HFA;VENTOLIN HFA) 108 (90 BASE) MCG/ACT inhaler Inhale 2 puffs into the lungs every 6 (six) hours as needed for wheezing.    [provider]  amoxicillin-clavulanate (AUGMENTIN) 875-125 MG tablet Take 1 tablet by mouth every 12 (twelve) hours. 07/08/17   Renne Crigler, PA-C  fluconazole (DIFLUCAN) 150 MG tablet Take 1 tablet (150 mg total) by mouth daily. 07/08/17   Renne Crigler, PA-C  levocetirizine (XYZAL) 5 MG tablet Take 1 tablet (5 mg total) by mouth every evening. 05/24/17   Wendling, Jilda Roche, DO  LO LOESTRIN FE 1 MG-10 MCG / 10 MCG tablet Take 1 tablet by mouth daily. 04/20/17   Levie Heritage, DO    Family History Family History  Problem Relation Age of Onset  . Mental illness Maternal Aunt   . Arthritis Maternal Grandfather   . Cancer Maternal Grandfather        Lung    Social History Social History   Tobacco Use  . Smoking status: Never Smoker  . Smokeless tobacco: Never Used  Substance Use Topics  . Alcohol use: Yes    Comment: Social  . Drug use: No     Allergies   Metronidazole   Review of Systems Review of Systems  Constitutional: Negative for fever.  HENT: Positive for dental problem, sinus pressure and sinus pain. Negative for ear pain, facial swelling, sore throat and trouble swallowing.   Respiratory: Negative for shortness of breath and stridor.   Musculoskeletal: Negative for neck pain.  Skin: Negative for color change.  Neurological: Positive for headaches.     Physical Exam Updated Vital Signs BP (!) 145/89 (BP Location: Left Arm)   Pulse 96   Temp 99.3 F (37.4 C) (Oral)   Resp 18   Ht 5\' 5"  (1.651 m)   Wt (!) 140.6 kg (310 lb)   LMP 07/08/2017   SpO2 99%   BMI 51.59  kg/m   Physical Exam  Constitutional: She appears well-developed and well-nourished.  HENT:  Head: Normocephalic and atraumatic.  Right Ear: Tympanic membrane, external ear and ear canal normal.  Left Ear: Tympanic membrane, external ear and ear canal normal.  Nose: Mucosal edema present.  Mouth/Throat: Uvula is midline, oropharynx is clear and moist and mucous membranes are normal. No trismus in the jaw. Abnormal dentition. No dental abscesses, uvula swelling or dental caries. No tonsillar abscesses.  Recent dental extraction of tooth #3 noted.  No purulent or other drainage.  Appears to be well-healing.  No associated abscess.  Eyes: Conjunctivae are normal.  Neck: Normal range of motion. Neck supple.  No neck swelling or Ludwig's angina  Lymphadenopathy:    She has no cervical adenopathy.  Neurological: She is alert.  Skin: Skin is warm and dry.    Psychiatric: She has a normal mood and affect.  Nursing note and vitals reviewed.    ED Treatments / Results  Labs (all labs ordered are listed, but only abnormal results are displayed) Labs Reviewed - No data to display  EKG None  Radiology No results found.  Procedures Procedures (including critical care time)  Medications Ordered in ED Medications  amoxicillin-clavulanate (AUGMENTIN) 875-125 MG per tablet 1 tablet (1 tablet Oral Given 07/08/17 1822)     Initial Impression / Assessment and Plan / ED Course  I have reviewed the triage vital signs and the nursing notes.  Pertinent labs & imaging results that were available during my care of the patient were reviewed by me and considered in my medical decision making (see chart for details).     Patient seen and examined.   Vital signs reviewed and are as follows: BP (!) 145/89 (BP Location: Left Arm)   Pulse 96   Temp 99.3 F (37.4 C) (Oral)   Resp 18   Ht 5\' 5"  (1.651 m)   Wt (!) 140.6 kg (310 lb)   LMP 07/08/2017   SpO2 99%   BMI 51.59 kg/m   We will treat patient for sinusitis.  Also given Diflucan per her request.  Discussed use of probiotic with Augmentin.  She will f/u with her oral surgeon as planned.    Final Clinical Impressions(s) / ED Diagnoses   Final diagnoses:  Sinus pain  Pain, dental   Patient with sinusitis symptoms after recent dental extraction.  Unclear if these are related.  Recent surgical wound appears to be healing well.  Treatment as above which will cover both sinusitis and dental infection.   ED Discharge Orders        Ordered    amoxicillin-clavulanate (AUGMENTIN) 875-125 MG tablet  Every 12 hours     07/08/17 1819    fluconazole (DIFLUCAN) 150 MG tablet  Daily     07/08/17 1819       Renne CriglerGeiple, Tyja Gortney, PA-C 07/08/17 1827    Derwood KaplanNanavati, Ankit, MD 07/08/17 2345

## 2017-07-13 ENCOUNTER — Telehealth: Payer: Self-pay

## 2017-07-13 MED ORDER — LO LOESTRIN FE 1 MG-10 MCG / 10 MCG PO TABS: 1.0000 | ORAL_TABLET | Freq: Every day | ORAL | 3 refills | Status: DC

## 2017-07-13 NOTE — Telephone Encounter (Signed)
Patient called stating that she never picked up her Lo Loestrin Fe because she had a  Few samples. She now states the pharmacy no longer has her script. Will resend prescription. Allison StammerJennifer Avalynn Bowe RN

## 2017-11-23 ENCOUNTER — Encounter: Payer: Self-pay | Admitting: Family Medicine

## 2017-11-23 ENCOUNTER — Ambulatory Visit (INDEPENDENT_AMBULATORY_CARE_PROVIDER_SITE_OTHER): Payer: BLUE CROSS/BLUE SHIELD | Admitting: *Deleted

## 2017-11-23 VITALS — BP 112/78 | HR 70 | Temp 97.9°F | Ht 65.0 in | Wt 306.1 lb

## 2017-11-23 DIAGNOSIS — N3001 Acute cystitis with hematuria: Secondary | ICD-10-CM | POA: Diagnosis not present

## 2017-11-23 LAB — POC URINALSYSI DIPSTICK (AUTOMATED)
Bilirubin, UA: NEGATIVE
GLUCOSE UA: NEGATIVE
KETONES UA: NEGATIVE
Leukocytes, UA: NEGATIVE
NITRITE UA: NEGATIVE
Protein, UA: NEGATIVE
Spec Grav, UA: 1.015 (ref 1.010–1.025)
Urobilinogen, UA: 0.2 E.U./dL
pH, UA: 6 (ref 5.0–8.0)

## 2017-11-23 MED ORDER — FLUCONAZOLE 150 MG PO TABS: 150.0000 mg | ORAL_TABLET | Freq: Every day | ORAL | 0 refills | Status: DC

## 2017-11-23 MED ORDER — SULFAMETHOXAZOLE-TRIMETHOPRIM 800-160 MG PO TABS: 1.0000 | ORAL_TABLET | Freq: Two times a day (BID) | ORAL | 0 refills | Status: DC

## 2017-11-23 NOTE — Progress Notes (Signed)
Pre visit review using our clinic review tool, if applicable. No additional management support is needed unless otherwise documented below in the visit note. 

## 2017-11-23 NOTE — Progress Notes (Signed)
Chief Complaint  Patient presents with  . Dysuria  . Hematuria    Allison Crosby is a 32 y.o. female here for possible UTI.  Duration: 2 days. Symptoms: hematuria,  and discomfort in bladder Denies: urinary frequency, urinary hesitancy, fever, nausea, vomiting, flank pain, vaginal discharge Hx of recurrent UTI? No Denies new sexual partners.  ROS:  Constitutional: denies fever GU: As noted in HPI  Past Medical History:  Diagnosis Date  . Asthma     BP 112/78 (BP Location: Left Arm, Patient Position: Sitting, Cuff Size: Large)   Pulse 70   Temp 97.9 F (36.6 C) (Oral)   Ht 5\' 5"  (1.651 m)   Wt (!) 306 lb 2 oz (138.9 kg)   SpO2 97%   BMI 50.94 kg/m  General: Awake, alert, appears stated age Heart: RRR Lungs: CTAB, normal respiratory effort, no accessory muscle usage Abd: BS+, soft, NT, ND, no masses or organomegaly MSK: No CVA tenderness, neg Lloyd's sign Psych: Age appropriate judgment and insight  Acute cystitis with hematuria - Plan: sulfamethoxazole-trimethoprim (BACTRIM DS,SEPTRA DS) 800-160 MG tablet, fluconazole (DIFLUCAN) 150 MG tablet, POCT Urinalysis Dipstick (Automated), Urine Culture  Orders as above. Cx. Stay hydrated. Seek immediate care if pt starts to develop fevers, new/worsening symptoms, uncontrollable N/V. F/u prn. The patient voiced understanding and agreement to the plan.  Jilda Roche [place], DO 11/23/17 8:37 AM

## 2017-11-23 NOTE — Patient Instructions (Addendum)
Stay hydrated.   Warning signs/symptoms: Uncontrollable nausea/vomiting, fevers, worsening symptoms despite treatment, confusion.  Give us around 2 business days to get culture back to you.  Let us know if you need anything. 

## 2017-11-25 ENCOUNTER — Other Ambulatory Visit: Payer: Self-pay | Admitting: Family Medicine

## 2017-11-25 LAB — URINE CULTURE
MICRO NUMBER:: 91286557
SPECIMEN QUALITY: ADEQUATE

## 2017-11-26 ENCOUNTER — Encounter: Payer: Self-pay | Admitting: Family Medicine

## 2017-12-18 ENCOUNTER — Emergency Department (HOSPITAL_BASED_OUTPATIENT_CLINIC_OR_DEPARTMENT_OTHER)
Admission: EM | Admit: 2017-12-18 | Discharge: 2017-12-18 | Disposition: A | Discharge status: Home / Self Care | Payer: BLUE CROSS/BLUE SHIELD | Attending: Emergency Medicine | Admitting: Emergency Medicine

## 2017-12-18 ENCOUNTER — Encounter (HOSPITAL_BASED_OUTPATIENT_CLINIC_OR_DEPARTMENT_OTHER): Payer: Self-pay | Admitting: *Deleted

## 2017-12-18 ENCOUNTER — Other Ambulatory Visit: Payer: Self-pay

## 2017-12-18 DIAGNOSIS — Y92410 Unspecified street and highway as the place of occurrence of the external cause: Secondary | ICD-10-CM | POA: Diagnosis not present

## 2017-12-18 DIAGNOSIS — S199XXA Unspecified injury of neck, initial encounter: Secondary | ICD-10-CM | POA: Diagnosis present

## 2017-12-18 DIAGNOSIS — J45909 Unspecified asthma, uncomplicated: Secondary | ICD-10-CM | POA: Insufficient documentation

## 2017-12-18 DIAGNOSIS — Y939 Activity, unspecified: Secondary | ICD-10-CM | POA: Insufficient documentation

## 2017-12-18 DIAGNOSIS — Y999 Unspecified external cause status: Secondary | ICD-10-CM | POA: Insufficient documentation

## 2017-12-18 DIAGNOSIS — S161XXA Strain of muscle, fascia and tendon at neck level, initial encounter: Secondary | ICD-10-CM | POA: Diagnosis not present

## 2017-12-18 MED ORDER — METHOCARBAMOL 500 MG PO TABS: 500.0000 mg | ORAL_TABLET | Freq: Three times a day (TID) | ORAL | 0 refills | Status: DC | PRN

## 2017-12-18 MED FILL — METHOCARBAMOL 500 MG TABLET: 500 | 6 days supply | Qty: 20 | Fill #0

## 2017-12-18 NOTE — ED Notes (Signed)
ED Provider at bedside. 

## 2017-12-18 NOTE — Discharge Instructions (Signed)
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
## 2018-02-11 ENCOUNTER — Inpatient Hospital Stay (HOSPITAL_COMMUNITY)
Admission: AD | Admit: 2018-02-11 | Discharge: 2018-02-11 | Disposition: A | Discharge status: Home / Self Care | Payer: BLUE CROSS/BLUE SHIELD | Attending: Obstetrics and Gynecology | Admitting: Obstetrics and Gynecology

## 2018-02-11 ENCOUNTER — Encounter (HOSPITAL_COMMUNITY): Payer: Self-pay | Admitting: *Deleted

## 2018-02-11 DIAGNOSIS — T192XXA Foreign body in vulva and vagina, initial encounter: Secondary | ICD-10-CM

## 2018-02-11 NOTE — MAU Provider Note (Signed)
History     Chief Complaint  Patient presents with  . tampon stuck   Allison Crosby is a 33 yo G0P0 female who presents with a chief complaint of "having a tampon stuck." Patient states that the tampon became stuck "horizontally" in her vagina one day ago. She claims to have tried removing it multiple times without success and has been seen at [facility] for removal. Patient says that providers at [facility] could not find the tampon during speculum exam, however, patient was still able to feel the tampon in her vagina, prompting her visit to MAU today. Patient denies vaginal and abdominal pain as well as vaginal bleeding and dysuria.           OB History    Gravida  0   Para  0   Term  0   Preterm  0   AB  0   Living  0     SAB  0   TAB  0   Ectopic  0   Multiple  0   Live Births  0               Past Medical History:  Diagnosis Date  . Asthma     History reviewed. No pertinent surgical history.       Family History  Problem Relation Age of Onset  . Mental illness Maternal Aunt   . Arthritis Maternal Grandfather   . Cancer Maternal Grandfather        Lung    Social History        Tobacco Use  . Smoking status: Never Smoker  . Smokeless tobacco: Never Used  Substance Use Topics  . Alcohol use: Yes    Comment: Social, 2x week  . Drug use: No    Allergies:      Allergies  Allergen Reactions  . Metronidazole Rash           Medications Prior to Admission  Medication Sig Dispense Refill Last Dose  . albuterol (PROVENTIL HFA;VENTOLIN HFA) 108 (90 BASE) MCG/ACT inhaler Inhale 2 puffs into the lungs every 6 (six) hours as needed for wheezing.   Taking  . levocetirizine (XYZAL) 5 MG tablet Take 1 tablet (5 mg total) by mouth every evening. 30 tablet 2 Taking  . LO LOESTRIN FE 1 MG-10 MCG / 10 MCG tablet Take 1 tablet by mouth daily. 3 Package 3 Taking  . methocarbamol (ROBAXIN) 500 MG tablet Take 1 tablet (500 mg total) by  mouth every 8 (eight) hours as needed for muscle spasms. 20 tablet 0     Review of Systems  Constitutional: Negative for fever.  Gastrointestinal: Negative for abdominal pain.  Genitourinary: Negative for dysuria, frequency and urgency.       Negative for vaginal pain, discharge, and bleeding.   Physical Exam Blood pressure 127/61, pulse 71, temperature 98.5 F (36.9 C), temperature source Oral, resp. rate 20, weight 133.9 kg, last menstrual period 02/08/2018, SpO2 98 %. Physical Exam  Constitutional: She is oriented to person, place, and time. She appears well-developed and well-nourished. No distress.  HENT:  Head: Normocephalic and atraumatic.  Cardiovascular: Normal rate and normal heart sounds.  Respiratory: Effort normal. No respiratory distress.  Genitourinary: Cervix exhibits no motion tenderness and no discharge.    No vaginal discharge.     Genitourinary Comments: Tampon noted laying horizontally and posterior to the cervix. Malodorous. Tampon was dislodged and removed easily with hemostats and remained intact.    Neurological: She  is alert and oriented to person, place, and time.    MAU Course Speculum exam was performed and tampon was noted posterior to cervix. Intact tampon was dislodged and removed easily with hemostats.    MDM 33 yo G0P0 female presenting with a tampon stuck in her vagina for 1 day without pain or any other complaint. Tampon was removed without complication. Patient tolerated this well and had no questions or concerns. Patient discharged home.   Nobuko Gsell, PA-S 02/11/2018

## 2018-02-11 NOTE — MAU Provider Note (Addendum)
History     CSN: [id number]  Arrival date and time: 02/11/18 1228   First Provider Initiated Contact with Patient 02/11/18 1259      Chief Complaint  Patient presents with  . tampon stuck   Allison Crosby is a 33 y.o. female who presents for inability to retrieve her tampon.  She states she put the tampon in one day ago and it has since turned horizontally resulting in her inability to reach it.  Patient states that she can feel the edge of the tampon, but can't grasp it.  She states she was seen at urgent care earlier today and received a pelvic exam, but was told nothing could be seen in the vagina.  Patient denies pain, discharge, or tenderness.  She further denies issues with urination.     Pertinent Gynecological History: Menses: flow is light Bleeding: Regular Menses-Light Flow upon tampon insertion Contraception: Not Assessed DES exposure: Not Assessed Blood transfusions: none Sexually transmitted diseases: Not Assessed Previous GYN Procedures: None    Past Medical History:  Diagnosis Date  . Asthma     History reviewed. No pertinent surgical history.  Family History  Problem Relation Age of Onset  . Mental illness Maternal Aunt   . Arthritis Maternal Grandfather   . Cancer Maternal Grandfather        Lung    Social History   Tobacco Use  . Smoking status: Never Smoker  . Smokeless tobacco: Never Used  Substance Use Topics  . Alcohol use: Yes    Comment: Social, 2x week  . Drug use: No    Allergies:  Allergies  Allergen Reactions  . Metronidazole Rash    Medications Prior to Admission  Medication Sig Dispense Refill Last Dose  . albuterol (PROVENTIL HFA;VENTOLIN HFA) 108 (90 BASE) MCG/ACT inhaler Inhale 2 puffs into the lungs every 6 (six) hours as needed for wheezing.   Taking  . levocetirizine (XYZAL) 5 MG tablet Take 1 tablet (5 mg total) by mouth every evening. 30 tablet 2 Taking  . LO LOESTRIN FE 1 MG-10 MCG / 10 MCG tablet Take 1 tablet  by mouth daily. 3 Package 3 Taking  . methocarbamol (ROBAXIN) 500 MG tablet Take 1 tablet (500 mg total) by mouth every 8 (eight) hours as needed for muscle spasms. 20 tablet 0     Review of Systems  Gastrointestinal: Negative for constipation, diarrhea, nausea and vomiting.  Genitourinary: Positive for vaginal bleeding. Negative for difficulty urinating, dysuria, pelvic pain and vaginal pain.   Physical Exam   Pulse 71, temperature 98.5 F (36.9 C), temperature source Oral, resp. rate 20, weight 133.9 kg, last menstrual period 02/08/2018, SpO2 98 %.  Physical Exam  Constitutional: She is oriented to person, place, and time. She appears well-developed and well-nourished. No distress.  HENT:  Head: Normocephalic and atraumatic.  Eyes: Conjunctivae are normal.  Neck: Normal range of motion.  Cardiovascular: Normal rate, regular rhythm and normal heart sounds.  Respiratory: Effort normal and breath sounds normal.  GI: Soft. Bowel sounds are normal.  Genitourinary: Cervix exhibits no discharge.    No vaginal discharge or bleeding.  No bleeding in the vagina.    There is a foreign body (Tampon Seen and Removed) in the vagina.     Genitourinary Comments: Speculum Exam: -Vaginal Vault: Pink mucosa, Mild Inflammation.  Tampon noted in posterior aspect of vault lateral to the cervix; Removed with forceps w/o difficulty; intact.  Apparent Malodor -Cervix: Pink, No lesions, cyst, or polyps.  No discharge from the os.    -Bimanual Exam: Deferred   Musculoskeletal: Normal range of motion.        General: No edema.  Neurological: She is alert and oriented to person, place, and time.  Skin: Skin is warm and dry.  Psychiatric: She has a normal mood and affect. Her behavior is normal.    MAU Course  Procedures  MDM Pelvic Exam Foreign Body Removal Assessment and Plan  Foreign Body in Vagina  -Removed tampon without incident. -Informed of need to monitor vaginal discharge and symptoms for  signs of infection. -Discussed that smell may be apparent for a few days but if no infection process, should subside after 2-3 days. -Patient without any q/c. -Encouraged to call or return to MAU if symptoms worsen or with the onset of new symptoms. -Discharged to home in stable condition  Cherre Robins MSN, CNM 02/11/2018, 12:59 PM

## 2018-02-11 NOTE — MAU Note (Signed)
Patient states she started her period on Friday and placed a tampon in her vagina yesterday afternoon and is unable to get it out.  Went to [facility] and they couldn't find it, sent her here.  She denies pain.  No bleeding since tampon has been in.

## 2018-02-11 NOTE — Discharge Instructions (Signed)
Vaginal Foreign Body    A vaginal foreign body is an object that gets stuck or left inside the vagina. Foreign bodies that are in the vagina for a long time can cause irritation, injury, and infection. In most cases, symptoms go away once the vaginal foreign body is found and removed. In rare cases, a foreign object can break through the walls of the vagina and cause a serious infection inside the pelvis and abdomen.  What are the causes?  The most common vaginal foreign bodies are:   Tampons.   Birth control (contraceptive) devices.   Toilet paper left in the vagina.   Small objects that were placed in the vagina out of curiosity but got stuck there.   A result of sexual abuse.  What are the signs or symptoms?  Symptoms of this condition include:   Light vaginal bleeding.   Blood-tinged vaginal fluid (discharge).   Vaginal discharge that smells bad.   Itching or burning in the vagina.   Redness, swelling, or rash near the opening of the vagina.   Pain in the abdomen.   Fever.   Burning or frequent urination.  How is this diagnosed?  This condition may be diagnosed based on:   Information you provide.   Your symptoms.   A physical exam.   Tests to check for bacteria (culture) in urine or vaginal discharge.   Checking your vagina with a small, lighted scope (vaginoscopy).   Using the fingers to examine your rectum (digital rectal exam).   Imaging tests of your vagina, such as:  ? Ultrasound.  ? X-ray.  ? CT scan. This is rare.  How is this treated?  This condition may be treated by:   Removing the vaginal foreign body, usually with instruments that look like tongs (forceps).   Removing the object under general anesthesia, if removing it is difficult and creates discomfort.   Other treatments may include:  ? Antibiotic medicine. You may need to take this medicine if you have a vaginal or urinary tract infection.  ? Emergency surgery (rare). This may be necessary if the walls of the vagina have  been damaged, allowing an infection to spread into the abdomen.  Once a vaginal foreign body has been removed, symptoms usually go away very quickly.  Follow these instructions at home:   Take over-the-counter and prescription medicines only as told by your health care provider.   If you were prescribed an antibiotic medicine, take it as told by your health care provider. Do not stop taking the antibiotic even if you start to feel better.   Do not have sex or use tampons until your health care provider approves.   Do not douche or use vaginal rinses unless your health care provider recommends it.   Keep all follow-up visits as told by your health care provider. This is important.  Contact a health care provider if:   You have abdominal pain or burning pain when urinating.   You have a fever.  Get help right away if:   You have heavy vaginal bleeding or discharge.   You have severe abdominal pain.  Summary   A vaginal foreign body is any object that gets stuck or left inside the vagina. Foreign bodies that are in the vagina for a long time can cause irritation, injury, and infection.   In most cases, symptoms go away once the vaginal foreign body is found and removed.   Do not have sex or use tampons   until your health care provider approves.  This information is not intended to replace advice given to you by your health care provider. Make sure you discuss any questions you have with your health care provider.  Document Released: 06/02/2013 Document Revised: 04/17/2016 Document Reviewed: 04/17/2016  Elsevier Interactive Patient Education  2019 [employer]

## 2018-02-11 NOTE — H&P (Deleted)
History    Chief Complaint  Patient presents with  . tampon stuck   Allison Crosby is a 33 yo G0P0 female who presents with a chief complaint of "having a tampon stuck." Patient states that the tampon became stuck "horizontally" in her vagina one day ago. She claims to have tried removing it multiple times without success and has been seen at [facility] for removal. Patient says that providers at [facility] could not find the tampon during speculum exam, however, patient was still able to feel the tampon in her vagina, prompting her visit to [facility] today. Patient denies vaginal and abdominal pain as well as vaginal bleeding and dysuria.   OB History    Gravida  0   Para  0   Term  0   Preterm  0   AB  0   Living  0     SAB  0   TAB  0   Ectopic  0   Multiple  0   Live Births  0           Past Medical History:  Diagnosis Date  . Asthma     History reviewed. No pertinent surgical history.  Family History  Problem Relation Age of Onset  . Mental illness Maternal Aunt   . Arthritis Maternal Grandfather   . Cancer Maternal Grandfather        Lung    Social History   Tobacco Use  . Smoking status: Never Smoker  . Smokeless tobacco: Never Used  Substance Use Topics  . Alcohol use: Yes    Comment: Social, 2x week  . Drug use: No    Allergies:  Allergies  Allergen Reactions  . Metronidazole Rash    Medications Prior to Admission  Medication Sig Dispense Refill Last Dose  . albuterol (PROVENTIL HFA;VENTOLIN HFA) 108 (90 BASE) MCG/ACT inhaler Inhale 2 puffs into the lungs every 6 (six) hours as needed for wheezing.   Taking  . levocetirizine (XYZAL) 5 MG tablet Take 1 tablet (5 mg total) by mouth every evening. 30 tablet 2 Taking  . LO LOESTRIN FE 1 MG-10 MCG / 10 MCG tablet Take 1 tablet by mouth daily. 3 Package 3 Taking  . methocarbamol (ROBAXIN) 500 MG tablet Take 1 tablet (500 mg total) by mouth every 8 (eight) hours as needed for muscle spasms. 20 tablet 0      Review of Systems  Constitutional: Negative for fever.  Gastrointestinal: Negative for abdominal pain.  Genitourinary: Negative for dysuria, frequency and urgency.       Negative for vaginal pain, discharge, and bleeding.   Physical Exam Blood pressure 127/61, pulse 71, temperature 98.5 F (36.9 C), temperature source Oral, resp. rate 20, weight 133.9 kg, last menstrual period 02/08/2018, SpO2 98 %. Physical Exam  Constitutional: She is oriented to person, place, and time. She appears well-developed and well-nourished. No distress.  HENT:  Head: Normocephalic and atraumatic.  Cardiovascular: Normal rate and normal heart sounds.  Respiratory: Effort normal. No respiratory distress.  Genitourinary: Cervix exhibits no motion tenderness and no discharge.    No vaginal discharge.     Genitourinary Comments: Tampon noted laying horizontally and posterior to the cervix. Malodorous. Tampon was dislodged and removed easily with hemostats and remained intact.    Neurological: She is alert and oriented to person, place, and time.    [facility] Course Speculum exam was performed and tampon was noted posterior to cervix. Intact tampon was dislodged and removed easily with hemostats.  MDM 33 yo G0P0 female presenting with a tampon stuck in her vagina for 1 day without pain or any other complaint. Tampon was removed without complication. Patient tolerated this well and had no questions or concerns. Patient discharged home.

## 2018-03-26 ENCOUNTER — Encounter: Payer: Self-pay | Admitting: Advanced Practice Midwife

## 2018-03-26 ENCOUNTER — Ambulatory Visit (INDEPENDENT_AMBULATORY_CARE_PROVIDER_SITE_OTHER): Payer: BLUE CROSS/BLUE SHIELD | Admitting: Advanced Practice Midwife

## 2018-03-26 VITALS — BP 126/65 | HR 79 | Ht 65.0 in | Wt 297.0 lb

## 2018-03-26 DIAGNOSIS — B977 Papillomavirus as the cause of diseases classified elsewhere: Secondary | ICD-10-CM | POA: Diagnosis not present

## 2018-03-26 DIAGNOSIS — Z01419 Encounter for gynecological examination (general) (routine) without abnormal findings: Secondary | ICD-10-CM | POA: Diagnosis not present

## 2018-03-26 DIAGNOSIS — Z1151 Encounter for screening for human papillomavirus (HPV): Secondary | ICD-10-CM | POA: Diagnosis not present

## 2018-03-26 DIAGNOSIS — Z124 Encounter for screening for malignant neoplasm of cervix: Secondary | ICD-10-CM

## 2018-03-26 DIAGNOSIS — Z113 Encounter for screening for infections with a predominantly sexual mode of transmission: Secondary | ICD-10-CM

## 2018-03-26 MED ORDER — FLUCONAZOLE 150 MG PO TABS: 150.0000 mg | ORAL_TABLET | ORAL | 3 refills | Status: DC | PRN

## 2018-03-26 NOTE — Progress Notes (Signed)
GYNECOLOGY ANNUAL PREVENTATIVE CARE ENCOUNTER NOTE  Subjective:   Allison Crosby is a 33 y.o. G0P0000 female here for a routine annual gynecologic exam.  Current complaints: intermittent yeast infections, none currently.  Would like PRN Rx for Diflucan..   Denies abnormal vaginal bleeding, discharge, pelvic pain, problems with intercourse or other gynecologic concerns.    Gynecologic History Patient's last menstrual period was 03/11/2018. Contraception: OCP (estrogen/progesterone) Last Pap: 03/02/2017. Results were: Normal with + HPV Last mammogram: none  Obstetric History OB History  Gravida Para Term Preterm AB Living  0 0 0 0 0 0  SAB TAB Ectopic Multiple Live Births  0 0 0 0 0    Past Medical History:  Diagnosis Date  . Asthma     No past surgical history on file.  Current Outpatient Medications on File Prior to Visit  Medication Sig Dispense Refill  . albuterol (PROVENTIL HFA;VENTOLIN HFA) 108 (90 BASE) MCG/ACT inhaler Inhale 2 puffs into the lungs every 6 (six) hours as needed for wheezing.    . methocarbamol (ROBAXIN) 500 MG tablet Take 1 tablet (500 mg total) by mouth every 8 (eight) hours as needed for muscle spasms. 20 tablet 0  . naproxen (NAPROSYN) 500 MG tablet Take by mouth.    . levocetirizine (XYZAL) 5 MG tablet Take 1 tablet (5 mg total) by mouth every evening. (Patient not taking: Reported on 03/26/2018) 30 tablet 2  . LO LOESTRIN FE 1 MG-10 MCG / 10 MCG tablet Take 1 tablet by mouth daily. (Patient not taking: Reported on 03/26/2018) 3 Package 3   No current facility-administered medications on file prior to visit.     Allergies  Allergen Reactions  . Metronidazole Rash and Itching    Social History   Socioeconomic History  . Marital status: Single    Spouse name: Not on file  . Number of children: Not on file  . Years of education: Not on file  . Highest education level: Not on file  Occupational History  . Not on file  Social Needs  . Financial  resource strain: Not on file  . Food insecurity:    Worry: Not on file    Inability: Not on file  . Transportation needs:    Medical: Not on file    Non-medical: Not on file  Tobacco Use  . Smoking status: Never Smoker  . Smokeless tobacco: Never Used  Substance and Sexual Activity  . Alcohol use: Yes    Comment: Social, 2x week  . Drug use: No  . Sexual activity: Yes    Birth control/protection: Condom  Lifestyle  . Physical activity:    Days per week: Not on file    Minutes per session: Not on file  . Stress: Not on file  Relationships  . Social connections:    Talks on phone: Not on file    Gets together: Not on file    Attends religious service: Not on file    Active member of club or organization: Not on file    Attends meetings of clubs or organizations: Not on file    Relationship status: Not on file  . Intimate partner violence:    Fear of current or ex partner: Not on file    Emotionally abused: Not on file    Physically abused: Not on file    Forced sexual activity: Not on file  Other Topics Concern  . Not on file  Social History Narrative  . Not on file  Family History  Problem Relation Age of Onset  . Mental illness Maternal Aunt   . Arthritis Maternal Grandfather   . Cancer Maternal Grandfather        Lung    The following portions of the patient's history were reviewed and updated as appropriate: allergies, current medications, past family history, past medical history, past social history, past surgical history and problem list.  Review of Systems Pertinent items noted in HPI and remainder of comprehensive ROS otherwise negative.   Objective:  BP 126/65   Pulse 79   Ht 5\' 5"  (1.651 m)   Wt 134.7 kg   LMP 03/11/2018   BMI 49.42 kg/m  CONSTITUTIONAL: Well-developed, well-nourished female in no acute distress.  HENT:  Normocephalic, atraumatic, External right and left ear normal. Oropharynx is clear and moist NECK: Normal range of motion,  supple, no masses.  Normal thyroid.  SKIN: Skin is warm and dry. No rash noted. Not diaphoretic. No erythema. No pallor. NEUROLOGIC: Alert and oriented to person, place, and time. Normal reflexes, muscle tone coordination. No cranial nerve deficit noted. PSYCHIATRIC: Normal mood and affect. Normal behavior. Normal judgment and thought content. CARDIOVASCULAR: Normal heart rate noted, regular rhythm RESPIRATORY: Clear to auscultation bilaterally. Effort and breath sounds normal, no problems with respiration noted. BREASTS: Symmetric in size. No masses, skin changes, nipple drainage, or lymphadenopathy. ABDOMEN: Soft, normal bowel sounds, no distention noted.  No tenderness, rebound or guarding.  PELVIC: Normal appearing external genitalia; normal appearing vaginal mucosa and cervix.  No abnormal discharge noted.  Pap smear obtained.  Normal uterine size, no other palpable masses, no uterine or adnexal tenderness.   Exam is limited by habitus MUSCULOSKELETAL: Normal range of motion. No tenderness.  No cyanosis, clubbing, or edema.     Assessment:  Annual gynecologic examination with pap smear Intermittent vaginal yeast infections   Plan:  Will follow up results of pap smear and manage accordingly Added HPV typing if still HPV + Rx Diflucan for PRN use sent to pharmacy Routine preventative health maintenance measures emphasized. Please refer to After Visit Summary for other counseling recommendations.

## 2018-03-27 LAB — HEPATITIS C ANTIBODY: Hep C Virus Ab: <0.1 s/co ratio (ref 0.0–0.9)

## 2018-03-27 LAB — RPR: RPR Ser Ql: NONREACTIVE

## 2018-03-27 LAB — HIV ANTIBODY (ROUTINE TESTING W REFLEX): HIV Screen 4th Generation wRfx: NONREACTIVE

## 2018-03-27 LAB — HEPATITIS B SURFACE ANTIGEN: Hepatitis B Surface Ag: NEGATIVE

## 2018-03-28 LAB — CYTOLOGY - PAP
Adequacy: ABSENT
CHLAMYDIA, DNA PROBE: NEGATIVE
Diagnosis: NEGATIVE
HPV: NOT DETECTED
NEISSERIA GONORRHEA: NEGATIVE

## 2018-03-29 ENCOUNTER — Ambulatory Visit: Payer: BLUE CROSS/BLUE SHIELD | Admitting: Family Medicine

## 2018-06-10 ENCOUNTER — Encounter: Payer: Self-pay | Admitting: Family Medicine

## 2018-08-05 ENCOUNTER — Encounter: Payer: Self-pay | Admitting: Family Medicine

## 2018-08-05 ENCOUNTER — Ambulatory Visit (INDEPENDENT_AMBULATORY_CARE_PROVIDER_SITE_OTHER): Payer: BLUE CROSS/BLUE SHIELD | Admitting: Family Medicine

## 2018-08-05 ENCOUNTER — Other Ambulatory Visit: Payer: Self-pay

## 2018-08-05 DIAGNOSIS — J302 Other seasonal allergic rhinitis: Secondary | ICD-10-CM | POA: Insufficient documentation

## 2018-08-05 DIAGNOSIS — J4520 Mild intermittent asthma, uncomplicated: Secondary | ICD-10-CM | POA: Diagnosis not present

## 2018-08-05 MED ORDER — FLUTICASONE PROPIONATE 50 MCG/ACT NA SUSP: 2.0000 | Freq: Every day | NASAL | 2 refills | Status: DC

## 2018-08-05 MED ORDER — ALBUTEROL SULFATE HFA 108 (90 BASE) MCG/ACT IN AERS: 2.0000 | INHALATION_SPRAY | Freq: Four times a day (QID) | RESPIRATORY_TRACT | 2 refills | Status: DC | PRN

## 2018-08-05 MED ORDER — LEVOCETIRIZINE DIHYDROCHLORIDE 5 MG PO TABS: 5.0000 mg | ORAL_TABLET | Freq: Every evening | ORAL | 2 refills | Status: DC

## 2018-08-05 NOTE — Progress Notes (Signed)
Chief Complaint  Patient presents with  . Medication Refill    Subjective: Patient is a 33 y.o. female here for f/u asthma/allergies. Due to COVID-19 pandemic, we are interacting via web portal for an electronic face-to-face visit. I verified patient's ID using 2 identifiers. Patient agreed to proceed with visit via this method. Patient is at home, I am at office. Patient and I are present for visit.   Pt has hx of allergies. Pollen is worse. Allergies and weather changes are triggers for asthma. Uses SABA 1-2x/mo at most. No SOB or wheezing. Uses Xyzal for allergies. No nasal spray. S/s's include itchy eyes, congestion, runny nose, itchy ears, pnd. No fevers.   ROS: Lungs: Denies SOB   Past Medical History:  Diagnosis Date  . Asthma     Objective: No conversational dyspnea Age appropriate judgment and insight Nml affect and mood  Assessment and Plan: Mild intermittent asthma without complication - Plan: albuterol (VENTOLIN HFA) 108 (90 Base) MCG/ACT inhaler  Seasonal allergies - Plan: levocetirizine (XYZAL) 5 MG tablet, fluticasone (FLONASE) 50 MCG/ACT nasal spray; may need to use INCS prn.   Orders as above. The patient voiced understanding and agreement to the plan.  [place], DO 08/05/18  2:02 PM

## 2018-12-18 ENCOUNTER — Telehealth: Payer: BLUE CROSS/BLUE SHIELD | Admitting: Nurse Practitioner

## 2018-12-18 DIAGNOSIS — J029 Acute pharyngitis, unspecified: Secondary | ICD-10-CM

## 2018-12-18 NOTE — Progress Notes (Signed)

## 2018-12-19 ENCOUNTER — Encounter: Payer: Self-pay | Admitting: Medical

## 2018-12-19 ENCOUNTER — Other Ambulatory Visit: Payer: Self-pay

## 2018-12-19 ENCOUNTER — Ambulatory Visit: Payer: BLUE CROSS/BLUE SHIELD | Admitting: Medical

## 2018-12-19 NOTE — Progress Notes (Signed)
   Subjective:    Patient ID: Allison Crosby, female    DOB: 1985-05-22, 33 y.o.   MRN: [id number]  HPI  Around 9:25 send face time message and no response. Called 3 times and no answer. Went direct to Designer, television/film set. So will see next pt. Jasmine called pt earlier and states talked to pt?  Review of Systems     Objective:   Physical Exam        Assessment & Plan:

## 2018-12-20 ENCOUNTER — Encounter: Payer: Self-pay | Admitting: Medical

## 2018-12-20 ENCOUNTER — Ambulatory Visit (INDEPENDENT_AMBULATORY_CARE_PROVIDER_SITE_OTHER): Payer: BLUE CROSS/BLUE SHIELD | Admitting: Medical

## 2018-12-20 ENCOUNTER — Other Ambulatory Visit: Payer: Self-pay

## 2018-12-20 DIAGNOSIS — R0981 Nasal congestion: Secondary | ICD-10-CM | POA: Diagnosis not present

## 2018-12-20 DIAGNOSIS — J040 Acute laryngitis: Secondary | ICD-10-CM | POA: Diagnosis not present

## 2018-12-20 MED ORDER — AMOXICILLIN 500 MG PO CAPS: 500.0000 mg | ORAL_CAPSULE | Freq: Three times a day (TID) | ORAL | 0 refills | Status: DC

## 2018-12-20 MED ORDER — FLUTICASONE PROPIONATE 50 MCG/ACT NA SUSP: 2.0000 | Freq: Every day | NASAL | 1 refills | Status: DC

## 2018-12-20 NOTE — Progress Notes (Signed)
   Subjective:    Patient ID: Allison Crosby, female    DOB: 1985-05-14, 33 y.o.   MRN: [id number]  HPI  Virtual Visit via Telephone Note  I connected with Allison Crosby on 12/20/18 at  9:20 AM EST by telephone and verified that I am speaking with the correct person using two identifiers.  Location: Patient: home Provider: office   I discussed the limitations, risks, security and privacy concerns of performing an evaluation and management service by telephone and the availability of in person appointments. I also discussed with the patient that there may be a patient responsible charge related to this service. The patient expressed understanding and agreed to proceed.  No bp cuff or thermometer. No weight check.  History of Present Illness:  Pt had evisit on 12/18/2018. She was diagnosed with viral illness/pharyngitis.   No pain on swallowing. Just states her voice is hoarse. Symptoms started on Saturday.  Does not feels lymph nodes swollen in neck. No fever, no chills or sweats. No body aches. No change in sense of smell. Pt states she talks all day at work. Slight nasal congestion at night but no sneezing.   Pt does not smoke.  LMP- Nov 29, 2018.  Pt is working from home.       Observations/Objective:  General- no acute distress, pleasant, oriented. Voice was hoarse on interview(moderate)  Assessment and Plan: You do have recent hoarseness of voice/laryngitis type symptoms.  I do think he is for you to rest her voice.  You continue to work since onset on Saturday.  I sent a work note to your MyChart account.  Hopefully over the next 3 days you will improve and be able to return to work early next week.  Please update me on how you are doing Monday.  If you have no improvement over the weekend despite conservative treatment plan to start amoxicillin.  However hopeful that you want me to start this.  Explained overuse of voice and viruses are most predominant causes.   For recent nasal congestion, prescribed Flonase.  If your symptoms worsen or change by Monday then we will get you tested for Covid.  However presently do not have suspicion for Covid.  Follow-up date to be determined.  Follow Up Instructions:    I discussed the assessment and treatment plan with the patient. The patient was provided an opportunity to ask questions and all were answered. The patient agreed with the plan and demonstrated an understanding of the instructions.   The patient was advised to call back or seek an in-person evaluation if the symptoms worsen or if the condition fails to improve as anticipated.  I provided 15 minutes of non-face-to-face time during this encounter.   Mackie Pai, PA-C     Review of Systems     Objective:   Physical Exam        Assessment & Plan:

## 2018-12-20 NOTE — Patient Instructions (Signed)
You do have recent hoarseness of voice/laryngitis type symptoms.  I do think he is for you to rest her voice.  You continue to work since onset on Saturday.  I sent a work note to your MyChart account.  Hopefully over the next 3 days you will improve and be able to return to work early next week.  Please update me on how you are doing Monday.  If you have no improvement over the weekend despite conservative treatment plan to start amoxicillin.  However hopeful that you want me to start this.  Explained overuse of voice and viruses are most predominant causes.  For recent nasal congestion, prescribed Flonase.  If your symptoms worsen or change by Monday then we will get you tested for Covid.  However presently do not have suspicion for Covid.  Follow-up date to be determined.

## 2018-12-23 ENCOUNTER — Encounter: Payer: Self-pay | Admitting: Medical

## 2018-12-23 ENCOUNTER — Telehealth: Payer: Self-pay | Admitting: Medical

## 2018-12-23 MED ORDER — FLUCONAZOLE 150 MG PO TABS: ORAL_TABLET | ORAL | 0 refills | Status: DC

## 2018-12-23 NOTE — Telephone Encounter (Signed)
Diflucan rx sent to pt pharmacy.
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
## 2019-06-26 ENCOUNTER — Other Ambulatory Visit: Payer: Self-pay

## 2019-06-26 ENCOUNTER — Encounter: Payer: Self-pay | Admitting: Family Medicine

## 2019-06-26 ENCOUNTER — Ambulatory Visit (INDEPENDENT_AMBULATORY_CARE_PROVIDER_SITE_OTHER): Payer: BLUE CROSS/BLUE SHIELD | Admitting: Family Medicine

## 2019-06-26 VITALS — BP 126/88 | HR 69 | Temp 97.5°F | Resp 17 | Ht 65.0 in | Wt 320.0 lb

## 2019-06-26 DIAGNOSIS — J302 Other seasonal allergic rhinitis: Secondary | ICD-10-CM | POA: Diagnosis not present

## 2019-06-26 DIAGNOSIS — J4520 Mild intermittent asthma, uncomplicated: Secondary | ICD-10-CM

## 2019-06-26 DIAGNOSIS — R300 Dysuria: Secondary | ICD-10-CM

## 2019-06-26 LAB — POCT URINALYSIS DIP (MANUAL ENTRY)
Bilirubin, UA: NEGATIVE
Glucose, UA: NEGATIVE mg/dL
Ketones, POC UA: NEGATIVE mg/dL
Nitrite, UA: POSITIVE — AB
Protein Ur, POC: NEGATIVE mg/dL
Spec Grav, UA: 1.015 (ref 1.010–1.025)
Urobilinogen, UA: 0.2 E.U./dL
pH, UA: 6.5 (ref 5.0–8.0)

## 2019-06-26 LAB — URINALYSIS, MICROSCOPIC ONLY

## 2019-06-26 MED ORDER — LEVOCETIRIZINE DIHYDROCHLORIDE 5 MG PO TABS: 5.0000 mg | ORAL_TABLET | Freq: Every evening | ORAL | 3 refills | Status: DC

## 2019-06-26 MED ORDER — FLUTICASONE PROPIONATE 50 MCG/ACT NA SUSP: 2.0000 | Freq: Every day | NASAL | 11 refills | Status: DC

## 2019-06-26 MED ORDER — NITROFURANTOIN MONOHYD MACRO 100 MG PO CAPS: 100.0000 mg | ORAL_CAPSULE | Freq: Two times a day (BID) | ORAL | 0 refills | Status: DC

## 2019-06-26 MED ORDER — ALBUTEROL SULFATE HFA 108 (90 BASE) MCG/ACT IN AERS: 2.0000 | INHALATION_SPRAY | Freq: Four times a day (QID) | RESPIRATORY_TRACT | 2 refills | Status: DC | PRN

## 2019-06-26 MED ORDER — FLUCONAZOLE 150 MG PO TABS: 150.0000 mg | ORAL_TABLET | Freq: Once | ORAL | 0 refills | Status: AC

## 2019-06-26 NOTE — Patient Instructions (Signed)
It was good to see you today!  We are going to treat you for a presumed UTI with macrobid twice a day for one week- I also gave you a diflucan in case you need it for yeast infection  Will be in touch with your urine culture Let me know if not feeling better in 1-2 days- Sooner if worse.

## 2019-06-26 NOTE — Progress Notes (Addendum)
[facility] [address] [place], [place] [address] [phone]  Date:  06/26/2019   Name:  Allison Crosby   DOB:  [birth year]   MRN:  [id number]  PCP:  Shelda Pal, DO    Chief Complaint: Urinary Frequency (odor, hesitancy)   History of Present Illness:  Allison Crosby is a 34 y.o. very pleasant female patient who presents with the following:  Generally healthy woman with history of asthma and allergies, obesity, primary patient of Dr. Nani Ravens.  Here today with concern of UTI  She has noticed sx since yesterday- discomfort and urinary frequency, and urine smelled bad this am No hematuria No fever or vomiting No abd or back pain   LMP was 06/04/19 No vaginal sx or itching   She uses Xyzal and Flonase nasal spray chronically for allergy rhinitis.  Also will occasionally use albuterol.  Needs a refill of these medications today  Patient Active Problem List   Diagnosis Date Noted  . Mild intermittent asthma without complication 24/58/0998  . Seasonal allergies 08/05/2018  . Pap smear abnormality of cervix/human papillomavirus (HPV) positive 03/08/2017  . Morbid obesity with BMI of 50.0-59.9, adult ([place]) 03/02/2017    Past Medical History:  Diagnosis Date  . Asthma     History reviewed. No pertinent surgical history.  Social History   Tobacco Use  . Smoking status: Never Smoker  . Smokeless tobacco: Never Used  Substance Use Topics  . Alcohol use: Yes    Comment: Social, 2x week  . Drug use: No    Family History  Problem Relation Age of Onset  . Mental illness Maternal Aunt   . Arthritis Maternal Grandfather   . Cancer Maternal Grandfather        Lung    Allergies  Allergen Reactions  . Metronidazole Rash and Itching    Medication list has been reviewed and updated.  Current Outpatient Medications on File Prior to Visit  Medication Sig Dispense Refill  . albuterol  (VENTOLIN HFA) 108 (90 Base) MCG/ACT inhaler Inhale 2 puffs into the lungs every 6 (six) hours as needed for wheezing. 18 g 2  . fluticasone (FLONASE) 50 MCG/ACT nasal spray Place 2 sprays into both nostrils daily. 16 g 2  . levocetirizine (XYZAL) 5 MG tablet Take 1 tablet (5 mg total) by mouth every evening. 90 tablet 2   No current facility-administered medications on file prior to visit.    Review of Systems:  As per HPI- otherwise negative.   Physical Examination: Vitals:   06/26/19 1109  BP: 126/88  Pulse: 69  Resp: 17  Temp: (!) 97.5 F (36.4 C)  SpO2: 98%   Vitals:   06/26/19 1109  Weight: (!) 320 lb (145.2 kg)  Height: 5\' 5"  (1.651 m)   Body mass index is 53.25 kg/m. Ideal Body Weight: Weight in (lb) to have BMI = 25: 149.9  GEN: no acute distress. HEENT: Atraumatic, Normocephalic.  Ears and Nose: No external deformity. CV: RRR, No M/G/R. No JVD. No thrill. No extra heart sounds. PULM: CTA B, no wheezes, crackles, rhonchi. No retractions. No resp. distress. No accessory muscle use. ABD: S, NT, ND, +BS. No rebound. No HSM. EXTR: No c/c/e PSYCH: Normally interactive. Conversant.  No CVA tenderness, belly is benign  Results for orders placed or performed in visit on 06/26/19  POCT urinalysis dipstick  Result Value Ref Range   Color, UA yellow yellow  Clarity, UA cloudy (A) clear   Glucose, UA negative negative mg/dL   Bilirubin, UA negative negative   Ketones, POC UA negative negative mg/dL   Spec Grav, UA 0.102 7.253 - 1.025   Blood, UA trace-intact (A) negative   pH, UA 6.5 5.0 - 8.0   Protein Ur, POC negative negative mg/dL   Urobilinogen, UA 0.2 0.2 or 1.0 E.U./dL   Nitrite, UA Positive (A) Negative   Leukocytes, UA Large (3+) (A) Negative      Assessment and Plan: Dysuria - Plan: Urine Culture, POCT urinalysis dipstick, Urine Microscopic Only, nitrofurantoin, macrocrystal-monohydrate, (MACROBID) 100 MG capsule, fluconazole (DIFLUCAN) 150 MG  tablet  Mild intermittent asthma without complication - Plan: albuterol (VENTOLIN HFA) 108 (90 Base) MCG/ACT inhaler  Seasonal allergies - Plan: fluticasone (FLONASE) 50 MCG/ACT nasal spray, levocetirizine (XYZAL) 5 MG tablet  Visit today due to UTI symptoms.  Urine culture and micro pending due to microhematuria.  Will start patient on Macrobid twice a day for 1 week for presumed UTI.  Gave Diflucan in case of monilia  Patient also needs a refill of her chronic allergy medications.  Refilled Flonase, Xyzal, Ventolin that she uses as needed  Asked her to let me know if UTI symptoms or not improved in the next 1 to 2 days, sooner if worse  Will plan further follow- up pending labs. Moderate medical decision making today This visit occurred during the SARS-CoV-2 public health emergency.  Safety protocols were in place, including screening questions prior to the visit, additional usage of staff PPE, and extensive cleaning of exam room while observing appropriate contact time as indicated for disinfecting solutions.     Signed Abbe Amsterdam, MD  Received her urine culture as follows- message to pt  Results for orders placed or performed in visit on 06/26/19  Urine Culture   Specimen: Urine  Result Value Ref Range   MICRO NUMBER: [id number]    SPECIMEN QUALITY: Adequate    Sample Source URINE    STATUS: FINAL    ISOLATE 1: Escherichia coli (A)       Susceptibility   Escherichia coli - URINE CULTURE, REFLEX    AMOX/CLAVULANIC <=2 Sensitive     AMPICILLIN <=2 Sensitive     AMPICILLIN/SULBACTAM <=2 Sensitive     CEFAZOLIN* <=4 Not Reportable      * For infections other than uncomplicated UTIcaused by E. coli, K. pneumoniae or P. mirabilis:Cefazolin is resistant if MIC > or = 8 mcg/mL.(Distinguishing susceptible versus intermediatefor isolates with MIC < or = 4 mcg/mL requiresadditional testing.)For uncomplicated UTI caused by E. coli,K. pneumoniae or P. mirabilis: Cefazolin  issusceptible if MIC <32 mcg/mL and predictssusceptible to the oral agents cefaclor, cefdinir,cefpodoxime, cefprozil, cefuroxime, cephalexinand loracarbef.    CEFEPIME <=1 Sensitive     CEFTRIAXONE <=1 Sensitive     CIPROFLOXACIN <=0.25 Sensitive     LEVOFLOXACIN <=0.12 Sensitive     ERTAPENEM <=0.5 Sensitive     GENTAMICIN <=1 Sensitive     IMIPENEM <=0.25 Sensitive     NITROFURANTOIN <=16 Sensitive     PIP/TAZO <=4 Sensitive     TOBRAMYCIN <=1 Sensitive     TRIMETH/SULFA* <=20 Sensitive      * For infections other than uncomplicated UTIcaused by E. coli, K. pneumoniae or P. mirabilis:Cefazolin is resistant if MIC > or = 8 mcg/mL.(Distinguishing susceptible versus intermediatefor isolates with MIC < or = 4 mcg/mL requiresadditional testing.)For uncomplicated UTI caused by E. coli,K. pneumoniae or P. mirabilis: Cefazolin issusceptible if MIC <  32 mcg/mL and predictssusceptible to the oral agents cefaclor, cefdinir,cefpodoxime, cefprozil, cefuroxime, cephalexinand loracarbef.Legend:S = Susceptible  I = IntermediateR = Resistant  NS = Not susceptible* = Not tested  NR = Not reported**NN = See antimicrobic comments  Urine Microscopic Only  Result Value Ref Range   WBC, UA 21-50/hpf (A) 0-2/hpf   RBC / HPF 0-2/hpf 0-2/hpf   Squamous Epithelial / LPF Few(5-10/hpf) (A) Rare(0-4/hpf)   Bacteria, UA 2+ (A) None  POCT urinalysis dipstick  Result Value Ref Range   Color, UA yellow yellow   Clarity, UA cloudy (A) clear   Glucose, UA negative negative mg/dL   Bilirubin, UA negative negative   Ketones, POC UA negative negative mg/dL   Spec Grav, UA 0.174 9.449 - 1.025   Blood, UA trace-intact (A) negative   pH, UA 6.5 5.0 - 8.0   Protein Ur, POC negative negative mg/dL   Urobilinogen, UA 0.2 0.2 or 1.0 E.U./dL   Nitrite, UA Positive (A) Negative   Leukocytes, UA Large (3+) (A) Negative

## 2019-06-28 ENCOUNTER — Encounter: Payer: Self-pay | Admitting: Family Medicine

## 2019-06-28 LAB — URINE CULTURE
MICRO NUMBER:: 10527167
SPECIMEN QUALITY:: ADEQUATE

## 2019-09-11 DIAGNOSIS — Z20822 Contact with and (suspected) exposure to covid-19: Secondary | ICD-10-CM | POA: Diagnosis not present

## 2019-11-26 ENCOUNTER — Other Ambulatory Visit (HOSPITAL_COMMUNITY)
Admission: RE | Admit: 2019-11-26 | Discharge: 2019-11-26 | Disposition: A | Discharge status: Home / Self Care | Payer: BLUE CROSS/BLUE SHIELD | Source: Ambulatory Visit | Attending: Family Medicine | Admitting: Family Medicine

## 2019-11-26 ENCOUNTER — Ambulatory Visit (INDEPENDENT_AMBULATORY_CARE_PROVIDER_SITE_OTHER): Payer: BLUE CROSS/BLUE SHIELD | Admitting: Family Medicine

## 2019-11-26 ENCOUNTER — Encounter: Payer: Self-pay | Admitting: Family Medicine

## 2019-11-26 ENCOUNTER — Other Ambulatory Visit: Payer: Self-pay

## 2019-11-26 VITALS — BP 128/72 | HR 81 | Ht 65.0 in | Wt 318.0 lb

## 2019-11-26 DIAGNOSIS — Z01419 Encounter for gynecological examination (general) (routine) without abnormal findings: Secondary | ICD-10-CM

## 2019-11-26 DIAGNOSIS — R87613 High grade squamous intraepithelial lesion on cytologic smear of cervix (HGSIL): Secondary | ICD-10-CM | POA: Diagnosis not present

## 2019-11-26 DIAGNOSIS — R87629 Unspecified abnormal cytological findings in specimens from vagina: Secondary | ICD-10-CM

## 2019-11-26 HISTORY — DX: Unspecified abnormal cytological findings in specimens from vagina: R87.629

## 2019-11-26 NOTE — Progress Notes (Signed)
GYNECOLOGY ANNUAL PREVENTATIVE CARE ENCOUNTER NOTE  Subjective:   Allison Crosby is a 34 y.o. G0P0000 female here for a routine annual gynecologic exam.  Current complaints: occasionally has a day of intramenstrual spotting, sometimes after sex.   Denies abnormal vaginal bleeding, discharge, pelvic pain, problems with intercourse or other gynecologic concerns.    Gynecologic History Patient's last menstrual period was 11/14/2019. Patient is sexually active  Contraception: none Last Pap: 2020. Results were: normal Last mammogram: n/a.  Obstetric History OB History  Gravida Para Term Preterm AB Living  0 0 0 0 0 0  SAB TAB Ectopic Multiple Live Births  0 0 0 0 0    Past Medical History:  Diagnosis Date  . Asthma     No past surgical history on file.  Current Outpatient Medications on File Prior to Visit  Medication Sig Dispense Refill  . albuterol (VENTOLIN HFA) 108 (90 Base) MCG/ACT inhaler Inhale 2 puffs into the lungs every 6 (six) hours as needed for wheezing. 18 g 2  . fluticasone (FLONASE) 50 MCG/ACT nasal spray Place 2 sprays into both nostrils daily. 16 g 11  . levocetirizine (XYZAL) 5 MG tablet Take 1 tablet (5 mg total) by mouth every evening. 90 tablet 3  . nitrofurantoin, macrocrystal-monohydrate, (MACROBID) 100 MG capsule Take 1 capsule (100 mg total) by mouth 2 (two) times daily. (Patient not taking: Reported on 11/26/2019) 14 capsule 0   No current facility-administered medications on file prior to visit.    Allergies  Allergen Reactions  . Metronidazole Rash and Itching    Social History   Socioeconomic History  . Marital status: Single    Spouse name: Not on file  . Number of children: Not on file  . Years of education: Not on file  . Highest education level: Not on file  Occupational History  . Not on file  Tobacco Use  . Smoking status: Never Smoker  . Smokeless tobacco: Never Used  Substance and Sexual Activity  . Alcohol use: Yes     Comment: Social, 2x week  . Drug use: No  . Sexual activity: Yes    Birth control/protection: Condom  Other Topics Concern  . Not on file  Social History Narrative  . Not on file   Social Determinants of Health   Financial Resource Strain:   . Difficulty of Paying Living Expenses: Not on file  Food Insecurity:   . Worried About Programme researcher, broadcasting/film/video in the Last Year: Not on file  . Ran Out of Food in the Last Year: Not on file  Transportation Needs:   . Lack of Transportation (Medical): Not on file  . Lack of Transportation (Non-Medical): Not on file  Physical Activity:   . Days of Exercise per Week: Not on file  . Minutes of Exercise per Session: Not on file  Stress:   . Feeling of Stress : Not on file  Social Connections:   . Frequency of Communication with Friends and Family: Not on file  . Frequency of Social Gatherings with Friends and Family: Not on file  . Attends Religious Services: Not on file  . Active Member of Clubs or Organizations: Not on file  . Attends Banker Meetings: Not on file  . Marital Status: Not on file  Intimate Partner Violence:   . Fear of Current or Ex-Partner: Not on file  . Emotionally Abused: Not on file  . Physically Abused: Not on file  . Sexually  Abused: Not on file    Family History  Problem Relation Age of Onset  . Mental illness Maternal Aunt   . Arthritis Maternal Grandfather   . Cancer Maternal Grandfather        Lung    The following portions of the patient's history were reviewed and updated as appropriate: allergies, current medications, past family history, past medical history, past social history, past surgical history and problem list.  Review of Systems Pertinent items are noted in HPI.   Objective:  BP 128/72   Pulse 81   Ht 5\' 5"  (1.651 m)   Wt (!) 318 lb 0.6 oz (144.3 kg)   LMP 11/14/2019   BMI 52.92 kg/m  Wt Readings from Last 3 Encounters:  11/26/19 (!) 318 lb 0.6 oz (144.3 kg)  06/26/19 (!)  320 lb (145.2 kg)  03/26/18 297 lb (134.7 kg)     Chaperone present during exam  CONSTITUTIONAL: Well-developed, well-nourished female in no acute distress.  HENT:  Normocephalic, atraumatic, External right and left ear normal. Oropharynx is clear and moist EYES: Conjunctivae and EOM are normal. Pupils are equal, round, and reactive to light. No scleral icterus.  NECK: Normal range of motion, supple, no masses.  Normal thyroid.   CARDIOVASCULAR: Normal heart rate noted, regular rhythm RESPIRATORY: Clear to auscultation bilaterally. Effort and breath sounds normal, no problems with respiration noted. BREASTS: Symmetric in size. No masses, skin changes, nipple drainage, or lymphadenopathy. ABDOMEN: Soft, normal bowel sounds, no distention noted.  No tenderness, rebound or guarding.  PELVIC: Normal appearing external genitalia; normal appearing vaginal mucosa and cervix.  No abnormal discharge noted.  MUSCULOSKELETAL: Normal range of motion. No tenderness.  No cyanosis, clubbing, or edema.  2+ distal pulses. SKIN: Skin is warm and dry. No rash noted. Not diaphoretic. No erythema. No pallor. NEUROLOGIC: Alert and oriented to person, place, and time. Normal reflexes, muscle tone coordination. No cranial nerve deficit noted. PSYCHIATRIC: Normal mood and affect. Normal behavior. Normal judgment and thought content.  Assessment:  Annual gynecologic examination with pap smear   Plan:  1. Well Woman Exam Will follow up results of pap smear and manage accordingly. STD testing discussed. Patient requested testing  Routine preventative health maintenance measures emphasized. Please refer to After Visit Summary for other counseling recommendations.    03/28/18, DO [facility]

## 2019-11-27 LAB — HEPATITIS C ANTIBODY: Hep C Virus Ab: <0.1 s/co ratio (ref 0.0–0.9)

## 2019-11-27 LAB — RPR: RPR Ser Ql: NONREACTIVE

## 2019-11-27 LAB — HIV ANTIBODY (ROUTINE TESTING W REFLEX): HIV Screen 4th Generation wRfx: NONREACTIVE

## 2019-11-27 LAB — HEPATITIS B SURFACE ANTIGEN: Hepatitis B Surface Ag: NEGATIVE

## 2019-11-28 LAB — CYTOLOGY - PAP
Chlamydia: NEGATIVE
Comment: NEGATIVE
Comment: NEGATIVE
Comment: NORMAL
Diagnosis: HIGH — AB
High risk HPV: POSITIVE — AB
Neisseria Gonorrhea: NEGATIVE

## 2019-12-12 ENCOUNTER — Encounter: Payer: BLUE CROSS/BLUE SHIELD | Admitting: Family Medicine

## 2019-12-31 ENCOUNTER — Encounter: Payer: Self-pay | Admitting: Family Medicine

## 2019-12-31 ENCOUNTER — Ambulatory Visit (INDEPENDENT_AMBULATORY_CARE_PROVIDER_SITE_OTHER): Payer: BC Managed Care – PPO | Admitting: Family Medicine

## 2019-12-31 ENCOUNTER — Other Ambulatory Visit: Payer: Self-pay

## 2019-12-31 ENCOUNTER — Other Ambulatory Visit (HOSPITAL_COMMUNITY)
Admission: RE | Admit: 2019-12-31 | Discharge: 2019-12-31 | Disposition: A | Discharge status: Home / Self Care | Payer: BC Managed Care – PPO | Source: Ambulatory Visit | Attending: Family Medicine | Admitting: Family Medicine

## 2019-12-31 VITALS — BP 110/77 | HR 70 | Ht 65.0 in | Wt 319.0 lb

## 2019-12-31 DIAGNOSIS — R87613 High grade squamous intraepithelial lesion on cytologic smear of cervix (HGSIL): Secondary | ICD-10-CM | POA: Diagnosis not present

## 2019-12-31 DIAGNOSIS — Z01812 Encounter for preprocedural laboratory examination: Secondary | ICD-10-CM | POA: Diagnosis not present

## 2019-12-31 DIAGNOSIS — N870 Mild cervical dysplasia: Secondary | ICD-10-CM | POA: Diagnosis not present

## 2019-12-31 LAB — POCT URINE PREGNANCY: Preg Test, Ur: NEGATIVE

## 2019-12-31 NOTE — Progress Notes (Signed)
Patient Name: Allison Crosby, female   DOB: 1985-09-16, 34 y.o.  MRN: [id number]  Colposcopy Procedure Note:  G0P0000 Pregnancy status: Unknown Indications: HSIL HPV:  Positive Cervical History:  Previous Abnormal Pap: Ascus +HPV in 2018, + HPV normal cytology in 2019.  Previous Colposcopy: yes, but no results available.  Previous LEEP or Cryo: none  Smoking: Never Smoked Hysterectomy: No   Patient given informed consent, signed copy in the chart, time out was performed.    Exam: Vulva and Vagina grossly normal.  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  Will call patient with results.

## 2019-12-31 NOTE — Progress Notes (Signed)
Patient presents for colposcopy. Adaly Puder RN  

## 2020-01-01 LAB — SURGICAL PATHOLOGY

## 2020-01-05 ENCOUNTER — Other Ambulatory Visit: Payer: Self-pay | Admitting: Family Medicine

## 2020-01-05 DIAGNOSIS — R300 Dysuria: Secondary | ICD-10-CM

## 2020-01-06 ENCOUNTER — Encounter: Payer: Self-pay | Admitting: Family Medicine

## 2020-01-06 ENCOUNTER — Other Ambulatory Visit: Payer: Self-pay

## 2020-01-06 ENCOUNTER — Ambulatory Visit (INDEPENDENT_AMBULATORY_CARE_PROVIDER_SITE_OTHER): Payer: BC Managed Care – PPO | Admitting: Family Medicine

## 2020-01-06 DIAGNOSIS — N3001 Acute cystitis with hematuria: Secondary | ICD-10-CM

## 2020-01-06 LAB — POC URINALSYSI DIPSTICK (AUTOMATED)
Bilirubin, UA: NEGATIVE
Blood, UA: NEGATIVE
Glucose, UA: NEGATIVE
Ketones, UA: NEGATIVE
Leukocytes, UA: NEGATIVE
Nitrite, UA: NEGATIVE
Protein, UA: NEGATIVE
Spec Grav, UA: 1.015 (ref 1.010–1.025)
Urobilinogen, UA: NEGATIVE E.U./dL — AB
pH, UA: 6 (ref 5.0–8.0)

## 2020-01-06 MED ORDER — SULFAMETHOXAZOLE-TRIMETHOPRIM 800-160 MG PO TABS: 1.0000 | ORAL_TABLET | Freq: Two times a day (BID) | ORAL | 0 refills | Status: DC

## 2020-01-06 MED ORDER — FLUCONAZOLE 150 MG PO TABS: ORAL_TABLET | ORAL | 0 refills | Status: DC

## 2020-01-06 NOTE — Progress Notes (Signed)
Chief Complaint  Patient presents with  . Urinary Tract Infection  . Dysuria    Allison Crosby is a 34 y.o. female here for possible UTI.  Duration: 1 days. Symptoms: Dysuria, urinary hesitancy Denies: urinary frequency, hematuria, urinary retention, fever, nausea, vomiting and flank pain, vaginal discharge Hx of recurrent UTI? No Denies new sexual partners.  Past Medical History:  Diagnosis Date  . Asthma   . Vaginal Pap smear, abnormal 11/26/2019   HSIL  +HPV     BP 132/82 (BP Location: Left Arm, Patient Position: Sitting, Cuff Size: Large)   Pulse 73   Temp 98.2 F (36.8 C) (Oral)   Ht 5\' 5"  (1.651 m)   Wt (!) 323 lb 2 oz (146.6 kg)   LMP 12/15/2019 (Exact Date)   SpO2 98%   BMI 53.77 kg/m  General: Awake, alert, appears stated age Heart: RRR Lungs: CTAB, normal respiratory effort, no accessory muscle usage Abd: BS+, soft, NT, ND, no masses or organomegaly MSK: No CVA tenderness, neg Lloyd's sign Psych: Age appropriate judgment and insight  Acute cystitis with hematuria - Plan: sulfamethoxazole-trimethoprim (BACTRIM DS) 800-160 MG tablet, fluconazole (DIFLUCAN) 150 MG tablet  UA neg, will cx. This has happened in past where she has neg UA's and +cx's. Will empirically tx given her s/s's consistent w previous infection. Diflucan as she does develop yeast infections w abx use.  Seek immediate care if pt starts to develop fevers, new/worsening symptoms, uncontrollable N/V. F/u prn. The patient voiced understanding and agreement to the plan.  12/17/2019 [place], DO 01/06/20 9:07 AM

## 2020-01-06 NOTE — Patient Instructions (Signed)
Stay hydrated.   Warning signs/symptoms: Uncontrollable nausea/vomiting, fevers, worsening symptoms despite treatment, confusion.  Give us around 2 business days to get culture back to you.  Let us know if you need anything. 

## 2020-01-06 NOTE — Addendum Note (Signed)
Addended by: Scharlene Gloss B on: 01/06/2020 09:10 AM   Modules accepted: Orders

## 2020-01-07 LAB — URINE CULTURE
MICRO NUMBER:: 11286389
SPECIMEN QUALITY:: ADEQUATE

## 2020-01-22 ENCOUNTER — Encounter: Payer: Self-pay | Admitting: Family Medicine

## 2020-01-22 ENCOUNTER — Ambulatory Visit (INDEPENDENT_AMBULATORY_CARE_PROVIDER_SITE_OTHER): Payer: BC Managed Care – PPO | Admitting: Family Medicine

## 2020-01-22 ENCOUNTER — Other Ambulatory Visit: Payer: Self-pay

## 2020-01-22 VITALS — BP 129/82 | HR 78 | Ht 65.0 in | Wt 323.0 lb

## 2020-01-22 DIAGNOSIS — N870 Mild cervical dysplasia: Secondary | ICD-10-CM | POA: Insufficient documentation

## 2020-01-22 DIAGNOSIS — Z23 Encounter for immunization: Secondary | ICD-10-CM | POA: Diagnosis not present

## 2020-01-22 NOTE — Progress Notes (Signed)
Patient presents for colposcopy results. Armandina Stammer RN

## 2020-01-22 NOTE — Progress Notes (Signed)
   Subjective:    Patient ID: Allison Crosby, female    DOB: 01/22/1986, 34 y.o.   MRN: [id number]  HPI Patient seen for f.u after Colposcopy. Report no problems.   Review of Systems     Objective:   Physical Exam Vitals reviewed.  Pulmonary:     Effort: Pulmonary effort is normal.  Psychiatric:        Mood and Affect: Mood normal.        Behavior: Behavior normal.        Thought Content: Thought content normal.        Judgment: Judgment normal.       Assessment & Plan:  1. Dysplasia of cervix, low grade (CIN 1) Discussed results - wants to preserve fertility. Recommended Colpo with HPV in 1 year. Discussed healthy lifestyle to support immune system. Also recommended Gardisil vaccine - will not treat current infection, but may help with reocurrance.

## 2020-03-02 DIAGNOSIS — Z03818 Encounter for observation for suspected exposure to other biological agents ruled out: Secondary | ICD-10-CM | POA: Diagnosis not present

## 2020-03-24 ENCOUNTER — Ambulatory Visit (INDEPENDENT_AMBULATORY_CARE_PROVIDER_SITE_OTHER): Payer: BC Managed Care – PPO

## 2020-03-24 ENCOUNTER — Other Ambulatory Visit: Payer: Self-pay

## 2020-03-24 DIAGNOSIS — Z23 Encounter for immunization: Secondary | ICD-10-CM

## 2020-03-24 NOTE — Progress Notes (Signed)
Chart reviewed - agree with CMA/RN documentation.  ° °

## 2020-03-24 NOTE — Progress Notes (Signed)
Patient presents for second injection of gardisil. Armandina Stammer RN

## 2020-04-29 ENCOUNTER — Ambulatory Visit: Payer: BC Managed Care – PPO | Admitting: Family Medicine

## 2020-06-01 ENCOUNTER — Other Ambulatory Visit: Payer: Self-pay

## 2020-06-01 ENCOUNTER — Encounter: Payer: Self-pay | Admitting: Family Medicine

## 2020-06-01 ENCOUNTER — Ambulatory Visit (INDEPENDENT_AMBULATORY_CARE_PROVIDER_SITE_OTHER): Payer: BC Managed Care – PPO | Admitting: Family Medicine

## 2020-06-01 VITALS — BP 128/74 | HR 88 | Temp 98.1°F | Ht 65.0 in | Wt 328.0 lb

## 2020-06-01 DIAGNOSIS — M79672 Pain in left foot: Secondary | ICD-10-CM

## 2020-06-01 DIAGNOSIS — J302 Other seasonal allergic rhinitis: Secondary | ICD-10-CM

## 2020-06-01 DIAGNOSIS — Z6841 Body Mass Index (BMI) 40.0 and over, adult: Secondary | ICD-10-CM | POA: Diagnosis not present

## 2020-06-01 MED ORDER — MELOXICAM 15 MG PO TABS: 15.0000 mg | ORAL_TABLET | Freq: Every day | ORAL | 0 refills | Status: DC

## 2020-06-01 MED ORDER — LEVOCETIRIZINE DIHYDROCHLORIDE 5 MG PO TABS: 5.0000 mg | ORAL_TABLET | Freq: Every evening | ORAL | 3 refills | Status: DC

## 2020-06-01 MED ORDER — FLUTICASONE PROPIONATE 50 MCG/ACT NA SUSP: 2.0000 | Freq: Every day | NASAL | 11 refills | Status: DC

## 2020-06-01 NOTE — Patient Instructions (Addendum)
Ice/cold pack over area for 10-15 min twice daily.  Consider Powerstep insoles. There are very quality over the counter inserts. Shop around online and in stores. Dr. Margart Sickles is a cheaper alternative, though is not as high of quality.   OK to take Tylenol 1000 mg (2 extra strength tabs) or 975 mg (3 regular strength tabs) every 6 hours as needed.  Try to avoid flats or shoes with poor support while you are healing.   Send me a message in 3-4 weeks if you aren't turning the corner.   Let us know if you need anything.

## 2020-06-01 NOTE — Progress Notes (Signed)
Musculoskeletal Exam  Patient: Allison Crosby DOB: 1985-04-05  DOS: 06/01/2020  SUBJECTIVE:  Chief Complaint:   Chief Complaint  Patient presents with  . Edema    Swelling and pain in left foot for one week    Tashona AUBRYANNA NESHEIM is a 35 y.o.  female for evaluation and treatment of L foot pain.   Onset:  1 week ago. Standing on concrete.  Location: Top of foot Character:  aching and sharp  Progression of issue:  is unchanged Associated symptoms: Darkness of skin, swelling; no redness Treatment: to date has been OTC NSAIDS.   Neurovascular symptoms: no  Past Medical History:  Diagnosis Date  . Asthma   . Vaginal Pap smear, abnormal 11/26/2019   HSIL  +HPV    Objective: VITAL SIGNS: BP 128/74 (BP Location: Left Arm, Patient Position: Sitting, Cuff Size: Large)   Pulse 88   Temp 98.1 F (36.7 C) (Oral)   Ht 5\' 5"  (1.651 m)   Wt (!) 328 lb (148.8 kg)   SpO2 97%   BMI 54.58 kg/m  Constitutional: Well formed, well developed. No acute distress. Thorax & Lungs: No accessory muscle use Musculoskeletal: L foot.   Tenderness to palpation: yes over the dorsum of the midfoot around 2-4 MT's Deformity: no Ecchymosis: no Neurologic: Normal sensory function. Gait slightly antalgic Psychiatric: Normal mood. Age appropriate judgment and insight. Alert & oriented x 3.    Assessment:  Left foot pain - Plan: meloxicam (MOBIC) 15 MG tablet  Seasonal allergies - Plan: levocetirizine (XYZAL) 5 MG tablet, fluticasone (FLONASE) 50 MCG/ACT nasal spray  Plan: Arch support, Mobic, ice, Tylenol. Sports med referral if no better in 3-4 weeks, she will send a message.  F/u in 3 mo for CPE or prn. The patient voiced understanding and agreement to the plan.   [place], DO 06/01/20  9:19 AM

## 2020-06-27 ENCOUNTER — Other Ambulatory Visit: Payer: Self-pay | Admitting: Family Medicine

## 2020-06-27 DIAGNOSIS — M79672 Pain in left foot: Secondary | ICD-10-CM

## 2020-07-22 ENCOUNTER — Other Ambulatory Visit: Payer: Self-pay

## 2020-07-22 ENCOUNTER — Other Ambulatory Visit (HOSPITAL_COMMUNITY)
Admission: RE | Admit: 2020-07-22 | Discharge: 2020-07-22 | Disposition: A | Discharge status: Home / Self Care | Payer: BC Managed Care – PPO | Source: Ambulatory Visit | Attending: Family Medicine | Admitting: Family Medicine

## 2020-07-22 ENCOUNTER — Ambulatory Visit (INDEPENDENT_AMBULATORY_CARE_PROVIDER_SITE_OTHER): Payer: BC Managed Care – PPO

## 2020-07-22 VITALS — BP 123/54 | HR 70 | Wt 326.8 lb

## 2020-07-22 DIAGNOSIS — N912 Amenorrhea, unspecified: Secondary | ICD-10-CM | POA: Diagnosis not present

## 2020-07-22 DIAGNOSIS — Z3202 Encounter for pregnancy test, result negative: Secondary | ICD-10-CM | POA: Diagnosis not present

## 2020-07-22 DIAGNOSIS — Z23 Encounter for immunization: Secondary | ICD-10-CM | POA: Diagnosis not present

## 2020-07-22 DIAGNOSIS — N898 Other specified noninflammatory disorders of vagina: Secondary | ICD-10-CM

## 2020-07-22 LAB — POCT URINE PREGNANCY: Preg Test, Ur: NEGATIVE

## 2020-07-22 NOTE — Progress Notes (Signed)
Chart reviewed - agree with CMA/RN documentation.  ° °

## 2020-07-22 NOTE — Progress Notes (Signed)
[name] here for  HPV   Injection.  Injection administered without complication. Patient will return in  as needed  for next injection.   Pt states LMP is 06/07/20. UPT is negative. Pt also requests to do self swab for BV. Self swab was sent to the lab.  Solstice Lastinger l Tyreanna Bisesi, CMA 07/22/2020  8:27 AM

## 2020-07-26 LAB — CERVICOVAGINAL ANCILLARY ONLY
Bacterial Vaginitis (gardnerella): POSITIVE — AB
Comment: NEGATIVE

## 2020-07-27 ENCOUNTER — Other Ambulatory Visit: Payer: Self-pay

## 2020-07-27 DIAGNOSIS — B9689 Other specified bacterial agents as the cause of diseases classified elsewhere: Secondary | ICD-10-CM

## 2020-07-27 DIAGNOSIS — B379 Candidiasis, unspecified: Secondary | ICD-10-CM

## 2020-07-27 MED ORDER — FLUCONAZOLE 150 MG PO TABS: 150.0000 mg | ORAL_TABLET | Freq: Once | ORAL | 0 refills | Status: AC

## 2020-07-27 MED ORDER — CLINDAMYCIN HCL 300 MG PO CAPS: ORAL_CAPSULE | ORAL | 0 refills | Status: DC

## 2020-07-27 NOTE — Progress Notes (Signed)
Pt called stating she tested positive for BV. Per Wynelle Bourgeois, CNM, Clindamycin 300 mg BID x 7 days was sent to her pharmacy. Pt also requests yeast medication. Flagyl 150 mg 1 tab po once was sent to her pharmacy. Kyrie Bun l Dawayne Ohair, CMA

## 2020-08-10 ENCOUNTER — Other Ambulatory Visit: Payer: Self-pay

## 2020-08-10 DIAGNOSIS — B379 Candidiasis, unspecified: Secondary | ICD-10-CM

## 2020-08-10 MED ORDER — FLUCONAZOLE 150 MG PO TABS: ORAL_TABLET | ORAL | 1 refills | Status: DC

## 2020-08-10 NOTE — Progress Notes (Signed)
Pt called requesting a refill of Diflucan. Pt states she is having some itching and vaginal discharge after taking antibiotics. Diflucan 150 mg PO once x 1 refill was sent to her pharmacy. Understanding was voiced. Revan Gendron l Micai Apolinar, CMA

## 2020-08-30 DIAGNOSIS — Z20822 Contact with and (suspected) exposure to covid-19: Secondary | ICD-10-CM | POA: Diagnosis not present

## 2020-09-01 ENCOUNTER — Telehealth: Payer: Self-pay | Admitting: *Deleted

## 2020-09-01 ENCOUNTER — Encounter: Payer: BC Managed Care – PPO | Admitting: Family Medicine

## 2020-09-01 NOTE — Telephone Encounter (Signed)
error 

## 2020-09-15 ENCOUNTER — Other Ambulatory Visit (HOSPITAL_COMMUNITY)
Admission: RE | Admit: 2020-09-15 | Discharge: 2020-09-15 | Disposition: A | Discharge status: Home / Self Care | Payer: BC Managed Care – PPO | Source: Ambulatory Visit | Attending: Family Medicine | Admitting: Family Medicine

## 2020-09-15 ENCOUNTER — Encounter: Payer: Self-pay | Admitting: Family Medicine

## 2020-09-15 ENCOUNTER — Ambulatory Visit (INDEPENDENT_AMBULATORY_CARE_PROVIDER_SITE_OTHER): Payer: BC Managed Care – PPO | Admitting: Family Medicine

## 2020-09-15 ENCOUNTER — Other Ambulatory Visit: Payer: Self-pay

## 2020-09-15 VITALS — BP 130/76 | HR 72 | Temp 97.9°F | Ht 65.0 in | Wt 326.4 lb

## 2020-09-15 DIAGNOSIS — R300 Dysuria: Secondary | ICD-10-CM | POA: Diagnosis not present

## 2020-09-15 DIAGNOSIS — N898 Other specified noninflammatory disorders of vagina: Secondary | ICD-10-CM | POA: Diagnosis not present

## 2020-09-15 LAB — POC URINALSYSI DIPSTICK (AUTOMATED)
Bilirubin, UA: NEGATIVE
Blood, UA: NEGATIVE
Glucose, UA: NEGATIVE
Ketones, UA: NEGATIVE
Leukocytes, UA: NEGATIVE
Nitrite, UA: NEGATIVE
Protein, UA: NEGATIVE
Spec Grav, UA: 1.015 (ref 1.010–1.025)
Urobilinogen, UA: 0.2 E.U./dL
pH, UA: 6 (ref 5.0–8.0)

## 2020-09-15 MED ORDER — CLINDAMYCIN HCL 300 MG PO CAPS: 300.0000 mg | ORAL_CAPSULE | Freq: Two times a day (BID) | ORAL | 0 refills | Status: DC

## 2020-09-15 MED ORDER — FLUCONAZOLE 150 MG PO TABS: ORAL_TABLET | ORAL | 0 refills | Status: DC

## 2020-09-15 NOTE — Patient Instructions (Addendum)
Stay hydrated.   Warning signs/symptoms: Uncontrollable nausea/vomiting, fevers, worsening symptoms despite treatment, confusion.  Give [place] around 2 business days to get culture/results back to you.  Let us know if you need anything.

## 2020-09-15 NOTE — Progress Notes (Signed)
Chief Complaint  Patient presents with   Dysuria    Allison Crosby is a 35 y.o. female here for possible UTI.  Duration: 2 days. Symptoms: Dysuria, urinary retention and d/c Denies: urinary frequency, hematuria, urinary hesitancy, fever, nausea, vomiting, and flank pain Hx of recurrent UTI? No Denies new sexual partners.  Past Medical History:  Diagnosis Date   Asthma    Vaginal Pap smear, abnormal 11/26/2019   HSIL  +HPV     BP 130/76   Pulse 72   Temp 97.9 F (36.6 C) (Oral)   Ht 5\' 5"  (1.651 m)   Wt (!) 326 lb 6 oz (148 kg)   SpO2 99%   BMI 54.31 kg/m  General: Awake, alert, appears stated age Heart: RRR Lungs: CTAB, normal respiratory effort, no accessory muscle usage Abd: BS+, soft, NT, ND, no masses or organomegaly MSK: No CVA tenderness, neg Lloyd's sign Psych: Age appropriate judgment and insight  Vaginal discharge - Plan: clindamycin (CLEOCIN) 300 MG capsule, fluconazole (DIFLUCAN) 150 MG tablet  Stay hydrated. Ck cx, BV, trich, candida. Allergy to Flagyl.  Seek immediate care if pt starts to develop fevers, new/worsening symptoms, uncontrollable N/V. F/u prn. The patient voiced understanding and agreement to the plan.  [place], DO 09/15/20 1:06 PM

## 2020-09-16 LAB — URINE CYTOLOGY ANCILLARY ONLY
Bacterial Vaginitis (gardnerella): NEGATIVE
Candida Urine: NEGATIVE
Comment: NEGATIVE
Comment: NEGATIVE
Trichomonas: NEGATIVE

## 2020-09-16 LAB — URINE CULTURE
MICRO NUMBER:: 12255227
SPECIMEN QUALITY:: ADEQUATE

## 2020-09-17 ENCOUNTER — Other Ambulatory Visit: Payer: Self-pay | Admitting: Family Medicine

## 2020-09-17 MED ORDER — SULFAMETHOXAZOLE-TRIMETHOPRIM 800-160 MG PO TABS: 1.0000 | ORAL_TABLET | Freq: Two times a day (BID) | ORAL | 0 refills | Status: AC

## 2020-10-13 ENCOUNTER — Encounter: Payer: Self-pay | Admitting: General Practice

## 2020-11-23 ENCOUNTER — Telehealth: Payer: Self-pay | Admitting: General Practice

## 2020-11-23 NOTE — Telephone Encounter (Signed)
Pt due in December 2022 for repeat Colpo.  Left message on VM for pt to contact our office to schedule.

## 2020-12-10 ENCOUNTER — Ambulatory Visit (INDEPENDENT_AMBULATORY_CARE_PROVIDER_SITE_OTHER): Payer: BC Managed Care – PPO

## 2020-12-10 ENCOUNTER — Other Ambulatory Visit (HOSPITAL_COMMUNITY)
Admission: RE | Admit: 2020-12-10 | Discharge: 2020-12-10 | Disposition: A | Discharge status: Home / Self Care | Payer: BC Managed Care – PPO | Source: Ambulatory Visit | Attending: Family Medicine | Admitting: Family Medicine

## 2020-12-10 ENCOUNTER — Other Ambulatory Visit: Payer: Self-pay

## 2020-12-10 VITALS — BP 133/73 | HR 88

## 2020-12-10 DIAGNOSIS — N898 Other specified noninflammatory disorders of vagina: Secondary | ICD-10-CM | POA: Diagnosis not present

## 2020-12-10 NOTE — Progress Notes (Signed)
SUBJECTIVE:  35 y.o. female complains of foul vaginal discharge for 3 day(s). Denies abnormal vaginal bleeding or significant pelvic pain or fever. Denies history of known exposure to STD. Patient requests testing for GC/Chl  LMP:11/20/20  OBJECTIVE:  She appears well, afebrile.   ASSESSMENT:  Vaginal Discharge  Vaginal Odor   PLAN:  GC, chlamydia, trichomonas, BVAG, CVAG probe sent to lab. Treatment: To be determined once lab results are received ROV prn if symptoms persist or worsen. Armandina Stammer RN

## 2020-12-13 LAB — CERVICOVAGINAL ANCILLARY ONLY
Bacterial Vaginitis (gardnerella): POSITIVE — AB
Candida Glabrata: NEGATIVE
Candida Vaginitis: NEGATIVE
Chlamydia: NEGATIVE
Comment: NEGATIVE
Comment: NEGATIVE
Comment: NEGATIVE
Comment: NEGATIVE
Comment: NEGATIVE
Comment: NORMAL
Neisseria Gonorrhea: NEGATIVE
Trichomonas: NEGATIVE

## 2020-12-14 ENCOUNTER — Telehealth: Payer: Self-pay

## 2020-12-14 DIAGNOSIS — B379 Candidiasis, unspecified: Secondary | ICD-10-CM

## 2020-12-14 DIAGNOSIS — N898 Other specified noninflammatory disorders of vagina: Secondary | ICD-10-CM

## 2020-12-14 DIAGNOSIS — B9689 Other specified bacterial agents as the cause of diseases classified elsewhere: Secondary | ICD-10-CM

## 2020-12-14 MED ORDER — FLUCONAZOLE 150 MG PO TABS
ORAL_TABLET | ORAL | 1 refills | Status: DC
Start: 2020-12-14 — End: 2021-04-13

## 2020-12-14 MED ORDER — CLINDAMYCIN HCL 300 MG PO CAPS: ORAL_CAPSULE | ORAL | 0 refills | Status: DC

## 2020-12-14 NOTE — Telephone Encounter (Signed)
Pt called requesting results. Pt made aware that she has BV. Pt is allergic to Metronidazole. Clindamycin 300 mg 2 tablets PO BID x 7 days was sent to her pharmacy. Pt also requests medication for yeast. Fluconazole 150 mg PO once was sent to her pharmacy. Euel Castile l Evamaria Detore, CMA

## 2021-01-20 ENCOUNTER — Encounter: Payer: Self-pay | Admitting: Family Medicine

## 2021-01-20 ENCOUNTER — Ambulatory Visit (INDEPENDENT_AMBULATORY_CARE_PROVIDER_SITE_OTHER): Payer: BC Managed Care – PPO | Admitting: Family Medicine

## 2021-01-20 ENCOUNTER — Other Ambulatory Visit: Payer: Self-pay

## 2021-01-20 ENCOUNTER — Other Ambulatory Visit (HOSPITAL_COMMUNITY)
Admission: RE | Admit: 2021-01-20 | Discharge: 2021-01-20 | Disposition: A | Discharge status: Home / Self Care | Payer: BC Managed Care – PPO | Source: Ambulatory Visit | Attending: Family Medicine | Admitting: Family Medicine

## 2021-01-20 VITALS — BP 133/71 | HR 79 | Ht 65.0 in | Wt 328.0 lb

## 2021-01-20 DIAGNOSIS — N870 Mild cervical dysplasia: Secondary | ICD-10-CM

## 2021-01-20 DIAGNOSIS — Z01419 Encounter for gynecological examination (general) (routine) without abnormal findings: Secondary | ICD-10-CM | POA: Diagnosis not present

## 2021-01-20 NOTE — Addendum Note (Signed)
Addended by: Anell Barr on: 01/20/2021 02:35 PM   Modules accepted: Orders

## 2021-01-20 NOTE — Progress Notes (Signed)
GYNECOLOGY ANNUAL PREVENTATIVE CARE ENCOUNTER NOTE  Subjective:   Allison Crosby is a 35 y.o. G0P0000 female here for a routine annual gynecologic exam.  Current complaints: none.   Denies abnormal vaginal bleeding, discharge, pelvic pain, problems with intercourse or other gynecologic concerns.    Gynecologic History Patient's last menstrual period was 12/28/2020 (exact date). Patient is sexually active  Contraception: condoms Last Pap: 12/2019. Results were: abnormal. Colpo CIN1 Last mammogram: n/a.  The pregnancy intention screening data noted above was reviewed. Potential methods of contraception were discussed. The patient elected to proceed with No data recorded.   Obstetric History OB History  Gravida Para Term Preterm AB Living  0 0 0 0 0 0  SAB IAB Ectopic Multiple Live Births  0 0 0 0 0    Past Medical History:  Diagnosis Date   Asthma    Vaginal Pap smear, abnormal 11/26/2019   HSIL  +HPV    History reviewed. No pertinent surgical history.  Current Outpatient Medications on File Prior to Visit  Medication Sig Dispense Refill   albuterol (VENTOLIN HFA) 108 (90 Base) MCG/ACT inhaler Inhale 2 puffs into the lungs every 6 (six) hours as needed for wheezing. 18 g 2   fluticasone (FLONASE) 50 MCG/ACT nasal spray Place 2 sprays into both nostrils daily. 16 g 11   levocetirizine (XYZAL) 5 MG tablet Take 1 tablet (5 mg total) by mouth every evening. 90 tablet 3   clindamycin (CLEOCIN) 300 MG capsule Take  tablet by mouth BID daily for 7 days. 14 capsule 0   fluconazole (DIFLUCAN) 150 MG tablet Take 1 tab, repeat in 72 hours if no improvement. 2 tablet 0   fluconazole (DIFLUCAN) 150 MG tablet Take 1 tablet by mouth. Repeat in 3 days if symptoms persist. 1 tablet 1   No current facility-administered medications on file prior to visit.    Allergies  Allergen Reactions   Metronidazole Rash and Itching    Social History   Socioeconomic History   Marital  status: Single    Spouse name: Not on file   Number of children: Not on file   Years of education: Not on file   Highest education level: Not on file  Occupational History   Not on file  Tobacco Use   Smoking status: Never   Smokeless tobacco: Never  Vaping Use   Vaping Use: Never used  Substance and Sexual Activity   Alcohol use: Yes    Comment: Social, 2x week   Drug use: No   Sexual activity: Yes    Birth control/protection: Condom  Other Topics Concern   Not on file  Social History Narrative   Not on file   Social Determinants of Health   Financial Resource Strain: Not on file  Food Insecurity: Not on file  Transportation Needs: Not on file  Physical Activity: Not on file  Stress: Not on file  Social Connections: Not on file  Intimate Partner Violence: Not on file    Family History  Problem Relation Age of Onset   Mental illness Maternal Aunt    Arthritis Maternal Grandfather    Cancer Maternal Grandfather        Lung    The following portions of the patient's history were reviewed and updated as appropriate: allergies, current medications, past family history, past medical history, past social history, past surgical history and problem list.  Review of Systems Pertinent items are noted in HPI.   Objective:  BP  133/71    Pulse 79    Ht 5\' 5"  (1.651 m)    Wt (!) 328 lb (148.8 kg)    LMP 12/28/2020 (Exact Date)    BMI 54.58 kg/m  Wt Readings from Last 3 Encounters:  01/20/21 (!) 328 lb (148.8 kg)  09/15/20 (!) 326 lb 6 oz (148 kg)  07/22/20 (!) 326 lb 12 oz (148.2 kg)     Chaperone present during exam  CONSTITUTIONAL: Well-developed, well-nourished female in no acute distress.  HENT:  Normocephalic, atraumatic, External right and left ear normal. Oropharynx is clear and moist EYES: Conjunctivae and EOM are normal. Pupils are equal, round, and reactive to light. No scleral icterus.  NECK: Normal range of motion, supple, no masses.  Normal thyroid.    CARDIOVASCULAR: Normal heart rate noted, regular rhythm RESPIRATORY: Clear to auscultation bilaterally. Effort and breath sounds normal, no problems with respiration noted. BREASTS: Symmetric in size. No masses, skin changes, nipple drainage, or lymphadenopathy. ABDOMEN: Soft, normal bowel sounds, no distention noted.  No tenderness, rebound or guarding.  PELVIC: Normal appearing external genitalia; normal appearing vaginal mucosa and cervix.  No abnormal discharge noted.  Normal uterine size, no other palpable masses, no uterine or adnexal tenderness. MUSCULOSKELETAL: Normal range of motion. No tenderness.  No cyanosis, clubbing, or edema.  2+ distal pulses. SKIN: Skin is warm and dry. No rash noted. Not diaphoretic. No erythema. No pallor. NEUROLOGIC: Alert and oriented to person, place, and time. Normal reflexes, muscle tone coordination. No cranial nerve deficit noted. PSYCHIATRIC: Normal mood and affect. Normal behavior. Normal judgment and thought content.  Assessment:  Annual gynecologic examination with pap smear   Plan:  1. Well Woman Exam Will follow up results of pap smear and manage accordingly. STD testing discussed. Patient requested testing  2. Dysplasia of cervix, low grade (CIN 1)    Routine preventative health maintenance measures emphasized. Please refer to After Visit Summary for other counseling recommendations.    07/24/20, DO [facility]

## 2021-02-02 LAB — CYTOLOGY - PAP
Chlamydia: NEGATIVE
Comment: NEGATIVE
Comment: NEGATIVE
Comment: NEGATIVE
Comment: NORMAL
Diagnosis: HIGH — AB
HPV 16: POSITIVE — AB
HPV 18 / 45: NEGATIVE
High risk HPV: POSITIVE — AB
Neisseria Gonorrhea: NEGATIVE

## 2021-02-23 ENCOUNTER — Encounter: Payer: Self-pay | Admitting: Family Medicine

## 2021-02-23 ENCOUNTER — Ambulatory Visit (INDEPENDENT_AMBULATORY_CARE_PROVIDER_SITE_OTHER): Payer: BC Managed Care – PPO | Admitting: Family Medicine

## 2021-02-23 ENCOUNTER — Other Ambulatory Visit (HOSPITAL_COMMUNITY)
Admission: RE | Admit: 2021-02-23 | Discharge: 2021-02-23 | Disposition: A | Discharge status: Home / Self Care | Payer: BC Managed Care – PPO | Source: Ambulatory Visit | Attending: Family Medicine | Admitting: Family Medicine

## 2021-02-23 ENCOUNTER — Other Ambulatory Visit: Payer: Self-pay

## 2021-02-23 VITALS — BP 139/76 | HR 71 | Ht 65.0 in | Wt 333.0 lb

## 2021-02-23 DIAGNOSIS — R87613 High grade squamous intraepithelial lesion on cytologic smear of cervix (HGSIL): Secondary | ICD-10-CM | POA: Diagnosis not present

## 2021-02-23 DIAGNOSIS — Z01812 Encounter for preprocedural laboratory examination: Secondary | ICD-10-CM

## 2021-02-23 DIAGNOSIS — N870 Mild cervical dysplasia: Secondary | ICD-10-CM | POA: Diagnosis not present

## 2021-02-23 LAB — POCT URINE PREGNANCY: Preg Test, Ur: NEGATIVE

## 2021-02-23 NOTE — Progress Notes (Signed)
Patient Name: Allison Crosby, female   DOB: [birth year], 36 y.o.  MRN: [id number]  Colposcopy Procedure Note:  G0P0000 Pregnancy status: Unknown Lab Results  Component Value Date   DIAGPAP (A) 01/20/2021    - High grade squamous intraepithelial lesion (HSIL)   DIAGPAP (A) 11/26/2019    - High grade squamous intraepithelial lesion (HSIL)   DIAGPAP  03/26/2018    NEGATIVE FOR INTRAEPITHELIAL LESIONS OR MALIGNANCY. BENIGN REACTIVE/REPARATIVE CHANGES.   HPV NOT DETECTED 03/26/2018   HPV DETECTED (A) 03/02/2017   HPVHIGH Positive (A) 01/20/2021   HPVHIGH Positive (A) 11/26/2019    Cervical History: Previous Colposcopy: 2022 - CIN1 Previous LEEP or Cryo: none  Smoking: Never Smoked Hysterectomy: No Other History:   Patient given informed consent, signed copy in the chart, time out was performed.    Exam: Vulva and Vagina grossly normal.  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  Will call patient with results.

## 2021-02-24 LAB — SURGICAL PATHOLOGY

## 2021-03-07 ENCOUNTER — Telehealth: Payer: Self-pay | Admitting: General Practice

## 2021-03-07 NOTE — Telephone Encounter (Signed)
Left message on VM for pt to contact our office to schedule LEEP.  Mychart message sent as well.

## 2021-03-07 NOTE — Telephone Encounter (Signed)
-----   Message from Levie Heritage, DO sent at 03/04/2021  1:00 PM EST ----- Please make an appointment for a LEEP.

## 2021-03-30 ENCOUNTER — Other Ambulatory Visit: Payer: Self-pay

## 2021-03-30 MED ORDER — FLUCONAZOLE 150 MG PO TABS: 150.0000 mg | ORAL_TABLET | Freq: Once | ORAL | 0 refills | Status: AC

## 2021-04-13 ENCOUNTER — Other Ambulatory Visit (HOSPITAL_COMMUNITY)
Admission: RE | Admit: 2021-04-13 | Discharge: 2021-04-13 | Disposition: A | Discharge status: Home / Self Care | Payer: BC Managed Care – PPO | Source: Ambulatory Visit | Attending: Family Medicine | Admitting: Family Medicine

## 2021-04-13 ENCOUNTER — Encounter: Payer: Self-pay | Admitting: Family Medicine

## 2021-04-13 ENCOUNTER — Encounter: Payer: BC Managed Care – PPO | Admitting: Family Medicine

## 2021-04-13 ENCOUNTER — Other Ambulatory Visit: Payer: Self-pay

## 2021-04-13 ENCOUNTER — Ambulatory Visit (INDEPENDENT_AMBULATORY_CARE_PROVIDER_SITE_OTHER): Payer: BC Managed Care – PPO | Admitting: Family Medicine

## 2021-04-13 VITALS — BP 106/54 | HR 80 | Wt 332.0 lb

## 2021-04-13 DIAGNOSIS — R87613 High grade squamous intraepithelial lesion on cytologic smear of cervix (HGSIL): Secondary | ICD-10-CM | POA: Diagnosis not present

## 2021-04-13 DIAGNOSIS — D069 Carcinoma in situ of cervix, unspecified: Secondary | ICD-10-CM | POA: Diagnosis not present

## 2021-04-13 NOTE — Addendum Note (Signed)
Addended by: Valentina Lucks on: 04/13/2021 02:23 PM ? ? Modules accepted: Orders ? ?

## 2021-04-13 NOTE — Progress Notes (Signed)
Patient has had HSIL PAP in 2021 and 2022, followed by CIN1 on colposcopy. I recommended to the patient that we proceed with LEEP as a precaution.  ? ?LEEP PROCEDURE NOTE ?Pap smear and colposcopy reviewed.   ?Pap HSIL ?Colpo Biopsy CIN1 ?ECC neg ?Risks, benefits, alternatives, and limitations of procedure explained to patient, including pain, bleeding, infection, failure to remove abnormal tissue and failure to cure dysplasia, need for repeat procedures, damage to pelvic organs, cervical incompetence.  Role of HPV,cervical dysplasia and need for close followup was empasized. Informed written consent was obtained. All questions were answered. Time out performed.  ???Procedure: The patient was placed in lithotomy position and the bivalved coated speculum was placed in the patient's vagina. A grounding pad placed on the patient. Lugol's solution was applied to the cervix and areas of decreased uptake were noted 11-12 o'clock.   Local anesthesia was administered via an intracervical block using 10cc of 2% Lidocaine with epinephrine. The suction was turned on and the Medium 1X Fisher Cone Biopsy Excisor on 82 Watts of cutting current was used to excise the area of decreased uptake and excise the entire transformation zone. Excellent hemostasis was achieved using roller ball coagulation set at 50 Watts coagulation current. Monsel's solution was then applied and the speculum was removed from the vagina. Specimens were sent to pathology. ??The patient tolerated the procedure well. Post-operative instructions given to patient, including instruction to seek medical attention for persistent bright red bleeding, fever, abdominal/pelvic pain, dysuria, nausea or vomiting. She was also told about the possibility of having copious yellow to black tinged discharge. She was counseled to avoid anything in the vagina (sex/douching/tampons) for 4-6 weeks. She has a  2-week post-operative check to review results and assess wound  healing. Follow up in 4 months for repeat pap or as needed. ? ?

## 2021-04-14 ENCOUNTER — Encounter: Payer: Self-pay | Admitting: General Practice

## 2021-04-18 ENCOUNTER — Encounter: Payer: Self-pay | Admitting: Family Medicine

## 2021-04-18 LAB — SURGICAL PATHOLOGY

## 2021-04-19 MED ORDER — FLUCONAZOLE 150 MG PO TABS: 150.0000 mg | ORAL_TABLET | ORAL | 3 refills | Status: DC

## 2021-04-19 MED ORDER — CLINDAMYCIN HCL 300 MG PO CAPS: 300.0000 mg | ORAL_CAPSULE | Freq: Two times a day (BID) | ORAL | 0 refills | Status: DC

## 2021-04-27 ENCOUNTER — Other Ambulatory Visit: Payer: Self-pay

## 2021-04-27 ENCOUNTER — Encounter: Payer: BC Managed Care – PPO | Admitting: Family Medicine

## 2021-04-27 ENCOUNTER — Ambulatory Visit (INDEPENDENT_AMBULATORY_CARE_PROVIDER_SITE_OTHER): Payer: BC Managed Care – PPO | Admitting: Family Medicine

## 2021-04-27 ENCOUNTER — Encounter: Payer: Self-pay | Admitting: Family Medicine

## 2021-04-27 VITALS — BP 119/77 | HR 73 | Wt 336.0 lb

## 2021-04-27 DIAGNOSIS — R87618 Other abnormal cytological findings on specimens from cervix uteri: Secondary | ICD-10-CM | POA: Diagnosis not present

## 2021-04-27 NOTE — Progress Notes (Signed)
? ?  Subjective:  ? ? Patient ID: Allison Crosby, female    DOB: 21-May-1985, 36 y.o.   MRN: [id number] ? ?HPI ?Seen for follow up LEEP. She did have increased discharge with foul odor. Took clinamycin, which stopped the odor. Still having some greyish discharge. No pain, bleeding.  ? ? ?Review of Systems ? ?   ?Objective:  ? Physical Exam ?Vitals reviewed. Exam conducted with a chaperone present.  ?Constitutional:   ?   Appearance: Normal appearance.  ?Abdominal:  ?   Hernia: There is no hernia in the left inguinal area or right inguinal area.  ?Genitourinary: ?   Labia:     ?   Right: No rash, tenderness or lesion.     ?   Left: No rash, tenderness or lesion.   ?   Vagina: Normal.  ?   Comments: Healing cervical tissue from LEEP.  No bleeding.  Normal granulation tissue. ?Lymphadenopathy:  ?   Lower Body: No right inguinal adenopathy. No left inguinal adenopathy.  ?Skin: ?   Capillary Refill: Capillary refill takes less than 2 seconds.  ?Neurological:  ?   General: No focal deficit present.  ?   Mental Status: She is alert.  ?Psychiatric:     ?   Mood and Affect: Mood normal.     ?   Behavior: Behavior normal.     ?   Thought Content: Thought content normal.  ? ?   ?Assessment & Plan:  ?1. Pap smear abnormality of cervix/human papillomavirus (HPV) positive ?Endocervical margin positive. Reviewed Colpo path - no abnormal ECC. Rpt PAP in 6 months. ? ?

## 2021-05-11 ENCOUNTER — Encounter: Payer: Self-pay | Admitting: Family Medicine

## 2021-05-11 ENCOUNTER — Ambulatory Visit (INDEPENDENT_AMBULATORY_CARE_PROVIDER_SITE_OTHER): Payer: BC Managed Care – PPO | Admitting: Family Medicine

## 2021-05-11 ENCOUNTER — Telehealth: Payer: Self-pay

## 2021-05-11 VITALS — BP 112/68 | HR 73 | Temp 98.5°F | Resp 18 | Ht 65.0 in | Wt 334.6 lb

## 2021-05-11 DIAGNOSIS — J302 Other seasonal allergic rhinitis: Secondary | ICD-10-CM | POA: Diagnosis not present

## 2021-05-11 DIAGNOSIS — J4520 Mild intermittent asthma, uncomplicated: Secondary | ICD-10-CM

## 2021-05-11 DIAGNOSIS — Z0000 Encounter for general adult medical examination without abnormal findings: Secondary | ICD-10-CM

## 2021-05-11 LAB — CBC
HCT: 36.9 % (ref 36.0–46.0)
Hemoglobin: 12 g/dL (ref 12.0–15.0)
MCHC: 32.5 g/dL (ref 30.0–36.0)
MCV: 83 fl (ref 78.0–100.0)
Platelets: 383 10*3/uL (ref 150.0–400.0)
RBC: 4.45 Mil/uL (ref 3.87–5.11)
RDW: 14.6 % (ref 11.5–15.5)
WBC: 7.5 10*3/uL (ref 4.0–10.5)

## 2021-05-11 LAB — LIPID PANEL
Cholesterol: 164 mg/dL (ref 0–200)
HDL: 45.5 mg/dL (ref >39.00)
LDL Cholesterol: 102 mg/dL — ABNORMAL HIGH (ref 0–99)
NonHDL: 118.64
Total CHOL/HDL Ratio: 4
Triglycerides: 83 mg/dL (ref 0.0–149.0)
VLDL: 16.6 mg/dL (ref 0.0–40.0)

## 2021-05-11 LAB — COMPREHENSIVE METABOLIC PANEL
ALT: 10 U/L (ref 0–35)
AST: 12 U/L (ref 0–37)
Albumin: 4 g/dL (ref 3.5–5.2)
Alkaline Phosphatase: 68 U/L (ref 39–117)
BUN: 12 mg/dL (ref 6–23)
CO2: 28 mEq/L (ref 19–32)
Calcium: 9.2 mg/dL (ref 8.4–10.5)
Chloride: 105 mEq/L (ref 96–112)
Creatinine, Ser: 0.85 mg/dL (ref 0.40–1.20)
GFR: 87.06 mL/min (ref >60.00)
Glucose, Bld: 91 mg/dL (ref 70–99)
Potassium: 4.3 mEq/L (ref 3.5–5.1)
Sodium: 140 mEq/L (ref 135–145)
Total Bilirubin: 0.6 mg/dL (ref 0.2–1.2)
Total Protein: 6.7 g/dL (ref 6.0–8.3)

## 2021-05-11 MED ORDER — FLUTICASONE PROPIONATE 50 MCG/ACT NA SUSP: 2.0000 | Freq: Every day | NASAL | 3 refills | Status: DC

## 2021-05-11 MED ORDER — SEMAGLUTIDE-WEIGHT MANAGEMENT 2.4 MG/0.75ML ~~LOC~~ SOAJ: 2.4000 mg | SUBCUTANEOUS | 0 refills | Status: DC

## 2021-05-11 MED ORDER — SEMAGLUTIDE-WEIGHT MANAGEMENT 1 MG/0.5ML ~~LOC~~ SOAJ: 1.0000 mg | SUBCUTANEOUS | 0 refills | Status: AC

## 2021-05-11 MED ORDER — ALBUTEROL SULFATE HFA 108 (90 BASE) MCG/ACT IN AERS: 2.0000 | INHALATION_SPRAY | Freq: Four times a day (QID) | RESPIRATORY_TRACT | 2 refills | Status: DC | PRN

## 2021-05-11 MED ORDER — LEVOCETIRIZINE DIHYDROCHLORIDE 5 MG PO TABS: 5.0000 mg | ORAL_TABLET | Freq: Every evening | ORAL | 3 refills | Status: DC

## 2021-05-11 MED ORDER — SEMAGLUTIDE-WEIGHT MANAGEMENT 1.7 MG/0.75ML ~~LOC~~ SOAJ: 1.7000 mg | SUBCUTANEOUS | 0 refills | Status: AC

## 2021-05-11 MED ORDER — SEMAGLUTIDE-WEIGHT MANAGEMENT 0.25 MG/0.5ML ~~LOC~~ SOAJ
0.2500 mg | SUBCUTANEOUS | 0 refills | Status: AC
Start: 2021-05-11 — End: 2021-06-08

## 2021-05-11 MED ORDER — SEMAGLUTIDE-WEIGHT MANAGEMENT 0.5 MG/0.5ML ~~LOC~~ SOAJ: 0.5000 mg | SUBCUTANEOUS | 0 refills | Status: AC

## 2021-05-11 NOTE — Telephone Encounter (Signed)
PA initiated via Covermymeds; KEY; [id number]. PA approved  ? ? CaseId:[id number];Status:Approved;Review Type:Prior Auth;Coverage Start Date:04/11/2021;Coverage End Date:12/07/2021; ?

## 2021-05-11 NOTE — Patient Instructions (Signed)
Give [place] 2-3 business days to get the results of your labs back.  ? ?Keep the diet clean and stay active. ? ?If you do not hear anything about your referral in the next 1-2 weeks, call our office and ask for an update. ? ?Please get me a copy of your advanced directive form at your convenience.  ? ?Don't fill the medicine if it is too expensive. ? ?Let us know if you need anything. ?

## 2021-05-11 NOTE — Progress Notes (Signed)
Chief Complaint  ?Patient presents with  ? Annual Exam  ?  Doing well , no complaints  ?  ? ?Well Woman ?Allison Crosby is here for a complete physical.   ?Her last physical was >1 year ago.  ?Current diet: in general, diet is fair. ?Current exercise: Walking treadmill. ?Contraception? Yes ?Patient's last menstrual period was 05/08/2021. ?Fatigue out of ordinary? No ?Seatbelt? Yes ?Advanced directive? No ? ?Health Maintenance ?Pap/HPV- Yes ?Tetanus- Yes ?HIV screening- Yes ?Hep C screening- Yes ? ?Morbid obesity ?The patient continues to struggle with weight loss.  Weight has been stable but she is trying to lose.  Diet is fair overall.  She walks 30 minutes on the treadmill around 3 times per week.  She keeps the speed relatively stable and is not just the elevation/incline.  She has never seen a nutritionist or tried any medication to help her lose weight.  She has never seen a specialist.  She does not have an interest in bariatric surgery. ? ?Past Medical History:  ?Diagnosis Date  ? Asthma   ? Vaginal Pap smear, abnormal 11/26/2019  ? HSIL  +HPV  ?  ? ?History reviewed. No pertinent surgical history. ? ?Medications  ?Flonase, Xyzal  ? ?Allergies ?Allergies  ?Allergen Reactions  ? Metronidazole Rash and Itching  ? ? ?Review of Systems: ?Constitutional:  no unexpected weight changes ?Eye:  no recent significant change in vision ?Ear/Nose/Mouth/Throat:  Ears:  no tinnitus or vertigo and no recent change in hearing ?Nose/Mouth/Throat:  no complaints of nasal congestion, no sore throat ?Cardiovascular: no chest pain ?Respiratory:  no cough and no shortness of breath ?Gastrointestinal:  no abdominal pain, no change in bowel habits ?GU:  Female: negative for dysuria or pelvic pain ?Musculoskeletal/Extremities:  no pain of the joints ?Integumentary (Skin/Breast):  no abnormal skin lesions reported ?Neurologic:  no headaches ?Endocrine:  denies fatigue ?Hematologic/Lymphatic:  No areas of easy bleeding ? ?Exam ?BP  112/68   Pulse 73   Temp 98.5 ?F (36.9 ?C)   Resp 18   Ht 5\' 5"  (1.651 m)   Wt (!) 334 lb 9.6 oz (151.8 kg)   LMP 05/08/2021   SpO2 100%   BMI 55.68 kg/m?  ?General:  well developed, well nourished, in no apparent distress ?Skin:  no significant moles, warts, or growths ?Head:  no masses, lesions, or tenderness ?Eyes:  pupils equal and round, sclera anicteric without injection ?Ears:  canals without lesions, TMs shiny without retraction, no obvious effusion, no erythema ?Nose:  nares patent, septum midline, mucosa normal, and no drainage or sinus tenderness ?Throat/Pharynx:  lips and gingiva without lesion; tongue and uvula midline; non-inflamed pharynx; no exudates or postnasal drainage ?Neck: neck supple without adenopathy, thyromegaly, or masses ?Lungs:  clear to auscultation, breath sounds equal bilaterally, no respiratory distress ?Cardio:  regular rate and rhythm, no bruits, no LE edema ?Abdomen:  abdomen soft, nontender; bowel sounds normal; no masses or organomegaly ?Genital: Defer to GYN ?Musculoskeletal:  symmetrical muscle groups noted without atrophy or deformity ?Extremities:  no clubbing, cyanosis, or edema, no deformities, no skin discoloration ?Neuro:  gait normal; deep tendon reflexes normal and symmetric ?Psych: well oriented with normal range of affect and appropriate judgment/insight ? ?Assessment and Plan ? ?Well adult exam - Plan: CBC, Lipid panel, Comprehensive metabolic panel ? ?Morbid obesity (HCC) - Plan: Semaglutide-Weight Management 0.25 MG/0.5ML SOAJ, Semaglutide-Weight Management 0.5 MG/0.5ML SOAJ, Semaglutide-Weight Management 1 MG/0.5ML SOAJ, Semaglutide-Weight Management 1.7 MG/0.75ML SOAJ, Semaglutide-Weight Management 2.4 MG/0.75ML SOAJ, Amb Ref  to Medical Weight Management ? ?Seasonal allergies - Plan: fluticasone (FLONASE) 50 MCG/ACT nasal spray, levocetirizine (XYZAL) 5 MG tablet ? ?Mild intermittent asthma without complication - Plan: albuterol (VENTOLIN HFA) 108 (90  Base) MCG/ACT inhaler  ? ?Well 36 y.o. female. ?Morbid obesity: Chronic, not controlled.  Refer to medical weight loss team.  Start Wegovy at 4 doses of each strength until maxing out at 2.4 mg weekly.  Do not fill if too expensive.  She does not have diabetes.  Counseled on diet and exercise.  We also discussed bariatric surgery and seeing a nutritionist. ?Advanced directive form provided today.  ?Other orders as above. ?Follow up in 1 year pending ability to get [facility]. ?The patient voiced understanding and agreement to the plan. ? ?Allison Dory, DO ?05/11/21 ?8:06 AM ? ?

## 2021-06-10 ENCOUNTER — Other Ambulatory Visit: Payer: Self-pay | Admitting: Family Medicine

## 2021-07-19 ENCOUNTER — Telehealth: Payer: Self-pay | Admitting: General Practice

## 2021-07-19 NOTE — Telephone Encounter (Signed)
Pt due to follow up with Dr. Adrian Blackwater in September for PAP/ECC.  Left message on VM for pt to contact our office to schedule.

## 2021-08-22 ENCOUNTER — Telehealth: Payer: Self-pay | Admitting: General Practice

## 2021-08-22 NOTE — Telephone Encounter (Signed)
Left message on VM for pt to contact our office to schedule follow up appt with Dr. Adrian Blackwater.

## 2021-08-22 NOTE — Telephone Encounter (Signed)
-----   Message from Marti Sleigh, [place] sent at 04/27/2021 11:09 AM EDT ----- Regarding: Pap smear Repeat pap in 6 months with Dr. Adrian Blackwater in October

## 2021-08-31 ENCOUNTER — Other Ambulatory Visit (HOSPITAL_COMMUNITY)
Admission: RE | Admit: 2021-08-31 | Discharge: 2021-08-31 | Disposition: A | Discharge status: Home / Self Care | Payer: BC Managed Care – PPO | Source: Ambulatory Visit | Attending: Family Medicine | Admitting: Family Medicine

## 2021-08-31 ENCOUNTER — Ambulatory Visit: Payer: BC Managed Care – PPO

## 2021-08-31 VITALS — BP 107/66 | HR 71 | Wt 308.0 lb

## 2021-08-31 DIAGNOSIS — N898 Other specified noninflammatory disorders of vagina: Secondary | ICD-10-CM | POA: Diagnosis not present

## 2021-08-31 NOTE — Progress Notes (Signed)
SUBJECTIVE:  36 y.o. female complains of white vaginal discharge with odor for 1 week(s). Denies abnormal vaginal bleeding or significant pelvic pain or fever. No UTI symptoms. Denies history of known exposure to STD.  Patient's last menstrual period was 08/13/2021 (exact date).  OBJECTIVE:  She appears well, afebrile. Urine dipstick: not done.  ASSESSMENT:  Vaginal Discharge  Vaginal Odor   PLAN:  GC, chlamydia, trichomonas, BVAG, CVAG probe sent to lab. Treatment: To be determined once lab results are received ROV prn if symptoms persist or worsen.   Summer Mccolgan l Particia Strahm, CMA

## 2021-09-01 LAB — CERVICOVAGINAL ANCILLARY ONLY
Bacterial Vaginitis (gardnerella): POSITIVE — AB
Candida Glabrata: NEGATIVE
Candida Vaginitis: NEGATIVE
Chlamydia: NEGATIVE
Comment: NEGATIVE
Comment: NEGATIVE
Comment: NEGATIVE
Comment: NEGATIVE
Comment: NEGATIVE
Comment: NORMAL
Neisseria Gonorrhea: NEGATIVE
Trichomonas: NEGATIVE

## 2021-09-02 ENCOUNTER — Other Ambulatory Visit: Payer: Self-pay

## 2021-09-02 DIAGNOSIS — B9689 Other specified bacterial agents as the cause of diseases classified elsewhere: Secondary | ICD-10-CM

## 2021-09-02 MED ORDER — CLINDAMYCIN HCL 300 MG PO CAPS: ORAL_CAPSULE | ORAL | 0 refills | Status: DC

## 2021-09-02 NOTE — Progress Notes (Signed)
Patient tested positive for BV. Clindamycin 300 mg BID x 7 days was sent to her pharmacy.  Nilay Mangrum l Panagiotis Oelkers, CMA

## 2021-09-02 NOTE — Progress Notes (Signed)
Patient called back and states she needs script for BV med to be sent to [facility] on [address] in [place]. Armandina Stammer RN

## 2021-09-04 ENCOUNTER — Other Ambulatory Visit: Payer: Self-pay | Admitting: Family Medicine

## 2021-09-05 NOTE — Telephone Encounter (Signed)
Called the patient left message to call back 

## 2021-09-05 NOTE — Telephone Encounter (Signed)
Patient called back and scheduled appt 

## 2021-09-05 NOTE — Telephone Encounter (Signed)
Sched med ck in 1-2 mo plz. Ty.

## 2021-09-09 ENCOUNTER — Other Ambulatory Visit: Payer: Self-pay | Admitting: Family Medicine

## 2021-09-09 DIAGNOSIS — J4520 Mild intermittent asthma, uncomplicated: Secondary | ICD-10-CM

## 2021-09-09 MED ORDER — WEGOVY 2.4 MG/0.75ML ~~LOC~~ SOAJ: SUBCUTANEOUS | 1 refills | Status: DC

## 2021-09-09 MED ORDER — ALBUTEROL SULFATE HFA 108 (90 BASE) MCG/ACT IN AERS: 2.0000 | INHALATION_SPRAY | Freq: Four times a day (QID) | RESPIRATORY_TRACT | 2 refills | Status: DC | PRN

## 2021-09-09 NOTE — Addendum Note (Signed)
Addended by: Scharlene Gloss B on: 09/09/2021 01:15 PM   Modules accepted: Orders

## 2021-09-13 ENCOUNTER — Encounter: Payer: Self-pay | Admitting: Family Medicine

## 2021-09-13 ENCOUNTER — Ambulatory Visit (INDEPENDENT_AMBULATORY_CARE_PROVIDER_SITE_OTHER): Payer: BC Managed Care – PPO | Admitting: Family Medicine

## 2021-09-13 DIAGNOSIS — G4700 Insomnia, unspecified: Secondary | ICD-10-CM | POA: Diagnosis not present

## 2021-09-13 MED ORDER — WEGOVY 2.4 MG/0.75ML ~~LOC~~ SOAJ: SUBCUTANEOUS | 2 refills | Status: DC

## 2021-09-13 MED ORDER — TRAZODONE HCL 50 MG PO TABS: 25.0000 mg | ORAL_TABLET | Freq: Every evening | ORAL | 3 refills | Status: DC | PRN

## 2021-09-13 NOTE — Patient Instructions (Addendum)
Keep the diet clean and stay active.  Aim to do some physical exertion for 150 minutes per week. This is typically divided into 5 days per week, 30 minutes per day. The activity should be enough to get your heart rate up. Anything is better than nothing if you have time constraints.  Stay hydrated. Take Metamucil or Benefiber daily.  Let me know if there are supply or cost issues.  Sleep Hygiene Tips: Do not watch TV or look at screens within 1 hour of going to bed. If you do, make sure there is a blue light filter (nighttime mode) involved. Try to go to bed around the same time every night. Wake up at the same time within 1 hour of regular time. Ex: If you wake up at 7 AM for work, do not sleep past 8 AM on days that you don't work. Do not drink alcohol before bedtime. Do not consume caffeine-containing beverages after noon or within 9 hours of intended bedtime. Get regular exercise/physical activity in your life, but not within 2 hours of planned bedtime. Do not take naps.  Do not eat within 2 hours of planned bedtime. Melatonin, 3-5 mg 30-60 minutes before planned bedtime may be helpful.  The bed should be for sleep or sex only. If after 20-30 minutes you are unable to fall asleep, get up and do something relaxing. Do this until you feel ready to go to sleep again.   Sleep is important to [place] all. Getting good sleep is imperative to adequate functioning during the day. Work with our counselors who are trained to help people obtain quality sleep. Call [phone] to schedule an appointment or if you are curious about insurance coverage/cost.  Let us know if you need anything.

## 2021-09-13 NOTE — Progress Notes (Signed)
Chief Complaint  Patient presents with   Follow-up    Medication-- Allison Crosby is going well    Subjective: Patient is a 36 y.o. female here for f/u obesity.  Patient is currently taking Wegovy 2.4 mg weekly.  She reports compliance and no adverse effects other than intermittent constipation.  She has lost around 20 pounds since starting this medication 3 and half months ago.  Her diet is improved and her portions have significantly reduced.  She is doing some walking.  No routine exercise.  Patient has a longstanding history of difficulty falling asleep.  She admits to having racing thoughts.  No known history of anxiety.  She is not currently following with a counselor or on treatment for anxiety.  She has tried melatonin with no significant relief.  Past Medical History:  Diagnosis Date   Asthma    Vaginal Pap smear, abnormal 11/26/2019   HSIL  +HPV    Objective: BP 128/80   Pulse 90   Temp 97.9 F (36.6 C) (Oral)   Ht 5\' 5"  (1.651 m)   Wt (!) 308 lb 4 oz (139.8 kg)   LMP 08/13/2021 (Exact Date)   SpO2 98%   BMI 51.30 kg/m  General: Awake, appears stated age Heart: RRR Lungs: CTAB, no rales, wheezes or rhonchi. No accessory muscle use Psych: Age appropriate judgment and insight, normal affect and mood  Assessment and Plan: Morbid obesity (HCC) - Plan: Semaglutide-Weight Management (WEGOVY) 2.4 MG/0.75ML SOAJ  Insomnia, unspecified type - Plan: traZODone (DESYREL) 50 MG tablet  Chronic, stable.  Continue Wegovy 2.4 mg weekly.  Counseled on diet and exercise.  Needs to do more weight resistance exercise. Chronic, uncontrolled.  Start trazodone 25-50 mg nightly as needed.  Counseling information provided.  Sleep hygiene information provided as well.  Follow-up in 1 month to recheck this. The patient voiced understanding and agreement to the plan.  08/15/2021 [place], DO 09/13/21  4:12 PM

## 2021-09-28 ENCOUNTER — Ambulatory Visit: Payer: BC Managed Care – PPO | Admitting: Family Medicine

## 2021-09-30 ENCOUNTER — Telehealth: Payer: BC Managed Care – PPO | Admitting: Family Medicine

## 2021-10-14 ENCOUNTER — Ambulatory Visit: Payer: BC Managed Care – PPO | Admitting: Family Medicine

## 2021-10-18 ENCOUNTER — Ambulatory Visit (INDEPENDENT_AMBULATORY_CARE_PROVIDER_SITE_OTHER): Payer: Self-pay | Admitting: Internal Medicine

## 2021-10-18 ENCOUNTER — Encounter (INDEPENDENT_AMBULATORY_CARE_PROVIDER_SITE_OTHER): Payer: Self-pay | Admitting: Internal Medicine

## 2021-10-18 VITALS — Ht 65.0 in | Wt 289.0 lb

## 2021-10-18 DIAGNOSIS — Z6841 Body Mass Index (BMI) 40.0 and over, adult: Secondary | ICD-10-CM

## 2021-10-18 DIAGNOSIS — R5383 Other fatigue: Secondary | ICD-10-CM

## 2021-10-18 NOTE — Progress Notes (Unsigned)
Office: [phone]  /  Fax: [phone]  Initial Visit  Allison Crosby was seen in clinic today to evaluate for obesity. She was referred by:   Dr. Nani Ravens . She is interested in losing weight to improve overall health and reduce the risk of weight related complications. Her weight goal is 250, it is the weight she feels comfortable in. She is currently at 8, was 336 in March of this year. She is not following a nutritional plan and is currently on Wegovy 2.4 weekly. She notes loss of appetite and early satiety. She is concerned may not be getting adequate nutrition. Feels some fatigue and sluggish at times. TWL close to 40 lbs since starting medication back in April. Denies AEs other than listed.   She presents today to review program treatment options, initial physical assessment, and evaluation.      Past medical history includes:   Past Medical History:  Diagnosis Date   Asthma    Vaginal Pap smear, abnormal 11/26/2019   HSIL  +HPV     Objective:   Ht 5\' 5"  (1.651 m)   Wt 289 lb (131.1 kg)   BMI 48.09 kg/m  She was weighed on the bioimpedance scale:  Body mass index is 48.09 kg/m.  General:  Alert, oriented and cooperative. Patient is in no acute distress.  Respiratory: Normal respiratory effort, no problems with respiration noted  Extremities: Normal range of motion.    Mental Status: Normal mood and affect. Normal behavior. Normal judgment and thought content.   Assessment and Plan:  1. Class 3 severe obesity without serious comorbidity with body mass index (BMI) of 45.0 to 49.9 in adult, unspecified obesity type (HCC)  2. Other fatigue - EKG 12-Lead  Fatigue due to excess adiposity and associated neurohormonal and biomechanical effects.      Obesity Action Plan:  She will work on garnering support from family and friends to begin weight loss journey. Work on eliminating or reducing the presence of highly processed, calorie dense foods in the  home. Complete provided nutritional and psychosocial assessment questionnaire.     Obesity Education Performed Today:  We discussed obesity as a disease and the importance of a more detailed evaluation of all the factors contributing to the disease.  We discussed the importance of long term lifestyle changes which include nutrition, exercise and behavioral modifications as well as the importance of customizing this to her specific health and social needs.  We discussed the benefits of reaching a healthier weight to alleviate the symptoms of existing conditions and reduce the risks of the biomechanical, metabolic and psychological effects of obesity.  We discussed the goals of this program is to improve her overall health and not simply achieve a specific BMI.  Frequent visits are very important to patient success. I plan to see her every 2 weeks for the first 3 months and then evaluate the visit frequency after that time. I explained obesity is a life-long chronic disease and long term treatments would be required. Medications to help her follow his eating plan may be offered as appropriate but are not required. All medication decisions will be made together after the initial workup is done and benefits and side effects are discussed in depth.  The clinic rules were reviewed including the late policy, cancellation policy, no show and program fees.  Allison Crosby appears to be in the action stage of change and states they are ready to start intensive lifestyle modifications and behavioral modifications. Review  plans of next visit which includes:   fasting labs, indirect calorimetry, ECG and a full nutritional and lifestyle evaluation.  25 minutes was spent today on this visit including the above counseling, pre-visit chart review, and post-visit documentation.

## 2021-10-18 NOTE — Progress Notes (Deleted)
  Office: [phone]  /  Fax: [phone]  New Patient Consultation  Deola AUDRIS SPEAKER was seen in clinic today to evaluate for obesity. We did a consultation to discuss her options for treatment and educate the patient on her disease state.She was referred by Dr. Nani Ravens. She is interested in losing weight to improve overall health. Her weight goal is 250, it is the weight she feels comfortable in. She is currently at 18, was 336 in March of this year. She is not following a nutritional plan and is currently on Wegovy 2.4 weekly. She notes loss of appetite and early satiety. She is concerned may not be getting adequate nutrition. Feels some fatigue and sluggish at times. She has lost close to 40 lbs since starting medication back in April. Denies AEs other than listed.  Riely Oetken Teall's evaluation and workup began today. She was weighed on the bioimpedance scale and results were discussed and documented in the synopsis. Height 5\' 5"  (1.651 m), weight 289 lb (131.1 kg). Body mass index is 48.09 kg/m.   Obesity education preformed today:  We discussed obesity as a disease and the importance of a more detailed evaluation of all the factors contributing to the disease.  We discussed the importance of long term lifestyle changes which include nutrition, exercise and behavioral modifications as well as the importance of customizing this to her specific health and social needs.  We discussed the benefits of reaching a healthier weight to alleviate the symptoms or reduce the risks of biomechanical, metabolic and psychological effects of obesity.  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  We discussed the goals of this program is to improve her overall health and not simply achieve a specific  BMI.  Frequent visits are very important to patient success. I plan to see her every 2 weeks for the first 3 months and then evaluate the visit frequency after that time. I explained obesity is a life-long chronic disease and long term treatments would be required. Medications to help her follow his eating plan may be offered as appropriate but are not required. All medication decisions will be made together after the initial workup is done and benefits and side effects are discussed in depth.  The clinic rules were reviewed including the late policy, cancellation policy, no show and program fees.  Annisa TAIA BRAMLETT appears to be in the action stage of change and agrees they are ready to start intensive lifestyle modifications and behavioral modifications. We will schedule a longer visit to continue the evaluation including fasting labs, indirect calorimetry, ECG and a full nutritional and lifestyle evaluation.  25 minutes was spent today on this visit including the above counseling, pre-visit chart review, and post-visit documentation.

## 2021-10-21 ENCOUNTER — Telehealth (INDEPENDENT_AMBULATORY_CARE_PROVIDER_SITE_OTHER): Payer: BC Managed Care – PPO | Admitting: Family Medicine

## 2021-10-21 ENCOUNTER — Encounter: Payer: Self-pay | Admitting: Family Medicine

## 2021-10-21 DIAGNOSIS — G4700 Insomnia, unspecified: Secondary | ICD-10-CM

## 2021-10-21 MED ORDER — DOXEPIN HCL 10 MG PO CAPS: 10.0000 mg | ORAL_CAPSULE | Freq: Every evening | ORAL | 0 refills | Status: DC | PRN

## 2021-10-21 NOTE — Progress Notes (Signed)
Chief Complaint  Patient presents with   Insomnia   Follow-up    Subjective: Patient is a 36 y.o. female here for f/u. Due to COVID-19 pandemic, we are interacting via web portal for an electronic face-to-face visit. I verified patient's ID using 2 identifiers. Patient agreed to proceed with visit via this method. Patient is at home, I am at office. Patient and I are present for visit.   Patient following up for insomnia.  She was started on trazodone 25-50 mg nightly at the last visit.  It worked well but she felt sleepy a few hours after waking up.  She would like to try something else.  She is not following with a therapist at this time.  She is able to get around 7-1/2 hours of sleep currently.  Past Medical History:  Diagnosis Date   Asthma    Vaginal Pap smear, abnormal 11/26/2019   HSIL  +HPV    Objective: No conversational dyspnea Age appropriate judgment and insight Nml affect and mood  Assessment and Plan: Insomnia, unspecified type - Plan: doxepin (SINEQUAN) 10 MG capsule  Chronic, adverse effect of medication.  Stop the trazodone, start doxepin 10 mg daily.  We discussed counseling and how this could help.  She would like to try the medicine first.  Counseled on exercise.  Follow-up in 1 month to recheck. The patient voiced understanding and agreement to the plan.  [place], DO 10/21/21  3:54 PM

## 2021-11-03 ENCOUNTER — Other Ambulatory Visit (HOSPITAL_COMMUNITY)
Admission: RE | Admit: 2021-11-03 | Discharge: 2021-11-03 | Disposition: A | Discharge status: Home / Self Care | Payer: BC Managed Care – PPO | Source: Ambulatory Visit | Attending: Family Medicine | Admitting: Family Medicine

## 2021-11-03 ENCOUNTER — Encounter: Payer: Self-pay | Admitting: Family Medicine

## 2021-11-03 ENCOUNTER — Ambulatory Visit (INDEPENDENT_AMBULATORY_CARE_PROVIDER_SITE_OTHER): Payer: BC Managed Care – PPO | Admitting: Family Medicine

## 2021-11-03 VITALS — BP 124/70 | HR 76 | Ht 65.0 in | Wt 291.0 lb

## 2021-11-03 DIAGNOSIS — D069 Carcinoma in situ of cervix, unspecified: Secondary | ICD-10-CM

## 2021-11-03 DIAGNOSIS — N72 Inflammatory disease of cervix uteri: Secondary | ICD-10-CM | POA: Diagnosis not present

## 2021-11-03 NOTE — Progress Notes (Signed)
   Subjective:    Patient ID: Allison Crosby, female    DOB: September 20, 1985, 36 y.o.   MRN: [id number]  HPI Patient seen for follow up of LEEP on 3/29. She had a positive endocervical margin (ECC negative on colposcopy). 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   Review of Systems     Objective:   Physical Exam Vitals reviewed. Exam conducted with a chaperone present.  Constitutional:      Appearance: Normal appearance.  Genitourinary:    Labia:        Right: No rash, tenderness or lesion.        Left: No rash, tenderness or lesion.      Vagina: Normal. No signs of injury. No vaginal discharge.     Cervix: No cervical motion tenderness, discharge, friability or lesion.  Neurological:     Mental Status: She is alert.       Assessment & Plan:  1. CIN III (cervical intraepithelial neoplasia grade III) with severe dysplasia PAP with ECC done

## 2021-11-03 NOTE — Progress Notes (Signed)
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

## 2021-11-07 LAB — SURGICAL PATHOLOGY

## 2021-11-08 LAB — CYTOLOGY - PAP: Diagnosis: NEGATIVE

## 2021-11-20 ENCOUNTER — Other Ambulatory Visit: Payer: Self-pay | Admitting: Family Medicine

## 2021-11-20 DIAGNOSIS — G4700 Insomnia, unspecified: Secondary | ICD-10-CM

## 2021-12-13 ENCOUNTER — Encounter: Payer: Self-pay | Admitting: General Practice

## 2021-12-16 ENCOUNTER — Other Ambulatory Visit: Payer: Self-pay | Admitting: Family Medicine

## 2021-12-16 DIAGNOSIS — J302 Other seasonal allergic rhinitis: Secondary | ICD-10-CM

## 2021-12-16 MED ORDER — FLUTICASONE PROPIONATE 50 MCG/ACT NA SUSP: 2.0000 | Freq: Every day | NASAL | 3 refills | Status: DC

## 2022-01-13 ENCOUNTER — Other Ambulatory Visit: Payer: Self-pay | Admitting: Family Medicine

## 2022-02-10 ENCOUNTER — Telehealth: Payer: Self-pay

## 2022-02-10 NOTE — Telephone Encounter (Signed)
PA form received from [place], form completed and faxed back to [phone]. Awaiting determination.

## 2022-02-13 NOTE — Telephone Encounter (Signed)
Request approved Effective 01/11/2022 through1/12/2023.  Patient notified.

## 2022-03-09 ENCOUNTER — Telehealth: Payer: Self-pay | Admitting: General Practice

## 2022-03-09 NOTE — Telephone Encounter (Signed)
Left message on VM for pt to contact our office to schedule 6 month follow up with Dr. Nehemiah Settle in April 2024.

## 2022-03-09 NOTE — Telephone Encounter (Signed)
-----   Message from Maurine Minister, [place] sent at 11/03/2021  4:16 PM EDT ----- Regarding: Follow Up 6 month follow up (around 05/05/2022) with Dr. Nehemiah Settle.

## 2022-05-08 ENCOUNTER — Ambulatory Visit (INDEPENDENT_AMBULATORY_CARE_PROVIDER_SITE_OTHER): Payer: BC Managed Care – PPO | Admitting: Family Medicine

## 2022-05-08 VITALS — BP 118/70 | HR 76 | Temp 97.4°F | Ht 65.0 in | Wt 267.2 lb

## 2022-05-08 DIAGNOSIS — J4520 Mild intermittent asthma, uncomplicated: Secondary | ICD-10-CM

## 2022-05-08 DIAGNOSIS — J302 Other seasonal allergic rhinitis: Secondary | ICD-10-CM

## 2022-05-08 MED ORDER — MONTELUKAST SODIUM 10 MG PO TABS: 10.0000 mg | ORAL_TABLET | Freq: Every day | ORAL | 3 refills | Status: DC

## 2022-05-08 NOTE — Progress Notes (Signed)
Chief Complaint  Patient presents with   Allergies    Having to use inhaler more often    Subjective: Patient is a 37 y.o. female here for asthma f/u.  Over the past 4 d, has been using her rescue inhaler more often.  She has been having some frontal sinus pressure, runny/stuffy nose, shortness of breath, coughing, and wheezing.  She has a history of asthma.  She takes albuterol inhaler which does help but does cause some shaking if she uses it too much.  No fevers, body aches, sore throat, sinus pain, ear pain/drainage from her ear.  She takes Flonase daily, switched from generic Xyzal to over-the-counter brand-name Xyzal as it became too expensive with insurance as a prescription.  She is not on a maintenance inhaler.  No close contacts.  Past Medical History:  Diagnosis Date   Asthma    Vaginal Pap smear, abnormal 11/26/2019   HSIL  +HPV    Objective: BP 118/70 (BP Location: Left Arm, Patient Position: Sitting, Cuff Size: Large)   Pulse 76   Temp (!) 97.4 F (36.3 C) (Oral)   Ht 5\' 5"  (1.651 m)   Wt 267 lb 4 oz (121.2 kg)   SpO2 99%   BMI 44.47 kg/m  General: Awake, appears stated age HEENT: Ears are patent without otorrhea, TMs negative bilaterally, no sinus TTP, no scleral injection, there is a patent without rhinorrhea, MMM, drainage in the pharynx appreciated, no sinus TTP Heart: RRR, no LE edema Lungs: CTAB, no rales, wheezes or rhonchi. No accessory muscle use Psych: Age appropriate judgment and insight, normal affect and mood  Assessment and Plan: Mild intermittent asthma without complication - Plan: montelukast (SINGULAIR) 10 MG tablet  Seasonal allergies - Plan: montelukast (SINGULAIR) 10 MG tablet  Exacerbation of chronic issue, likely 2/2 #2. Cont INCS, Xyzal. Add Singulair 10 mg/d. Cont SABA prn. F/u in 4 weeks for a CPE and to reck this.  The patient voiced understanding and agreement to the plan.  Jilda Roche [place], DO 05/08/22  1:05 PM

## 2022-05-08 NOTE — Patient Instructions (Addendum)
Claritin (loratadine), Allegra (fexofenadine), Zyrtec (cetirizine) which is also equivalent to Xyzal (levocetirizine); these are listed in order from weakest to strongest. Generic, and therefore cheaper, options are in the parentheses.   There are available OTC, and the generic versions, which may be cheaper, are in parentheses. Show this to a pharmacist if you have trouble finding any of these items.  Continue the Flonase and the albuterol as needed.   Let us know if you need anything.

## 2022-05-15 ENCOUNTER — Encounter: Payer: BC Managed Care – PPO | Admitting: Family Medicine

## 2022-06-14 ENCOUNTER — Encounter: Payer: Self-pay | Admitting: Family Medicine

## 2022-06-14 ENCOUNTER — Ambulatory Visit (INDEPENDENT_AMBULATORY_CARE_PROVIDER_SITE_OTHER): Payer: BC Managed Care – PPO | Admitting: Family Medicine

## 2022-06-14 ENCOUNTER — Other Ambulatory Visit: Payer: Self-pay | Admitting: Family Medicine

## 2022-06-14 VITALS — BP 112/80 | HR 81 | Temp 97.9°F | Ht 65.0 in | Wt 268.4 lb

## 2022-06-14 DIAGNOSIS — Z0000 Encounter for general adult medical examination without abnormal findings: Secondary | ICD-10-CM | POA: Diagnosis not present

## 2022-06-14 DIAGNOSIS — J4520 Mild intermittent asthma, uncomplicated: Secondary | ICD-10-CM | POA: Diagnosis not present

## 2022-06-14 DIAGNOSIS — J302 Other seasonal allergic rhinitis: Secondary | ICD-10-CM

## 2022-06-14 DIAGNOSIS — G4700 Insomnia, unspecified: Secondary | ICD-10-CM | POA: Diagnosis not present

## 2022-06-14 LAB — COMPREHENSIVE METABOLIC PANEL
ALT: 10 U/L (ref 0–35)
AST: 13 U/L (ref 0–37)
Albumin: 3.8 g/dL (ref 3.5–5.2)
Alkaline Phosphatase: 60 U/L (ref 39–117)
BUN: 9 mg/dL (ref 6–23)
CO2: 30 mEq/L (ref 19–32)
Calcium: 9.2 mg/dL (ref 8.4–10.5)
Chloride: 103 mEq/L (ref 96–112)
Creatinine, Ser: 0.86 mg/dL (ref 0.40–1.20)
GFR: 85.19 mL/min (ref >60.00)
Glucose, Bld: 84 mg/dL (ref 70–99)
Potassium: 4.1 mEq/L (ref 3.5–5.1)
Sodium: 140 mEq/L (ref 135–145)
Total Bilirubin: 0.4 mg/dL (ref 0.2–1.2)
Total Protein: 6.8 g/dL (ref 6.0–8.3)

## 2022-06-14 LAB — LIPID PANEL
Cholesterol: 150 mg/dL (ref 0–200)
HDL: 43.7 mg/dL (ref >39.00)
LDL Cholesterol: 90 mg/dL (ref 0–99)
NonHDL: 106.28
Total CHOL/HDL Ratio: 3
Triglycerides: 83 mg/dL (ref 0.0–149.0)
VLDL: 16.6 mg/dL (ref 0.0–40.0)

## 2022-06-14 LAB — CBC
HCT: 37.1 % (ref 36.0–46.0)
Hemoglobin: 12.3 g/dL (ref 12.0–15.0)
MCHC: 33.1 g/dL (ref 30.0–36.0)
MCV: 84.7 fl (ref 78.0–100.0)
Platelets: 422 10*3/uL — ABNORMAL HIGH (ref 150.0–400.0)
RBC: 4.38 Mil/uL (ref 3.87–5.11)
RDW: 14 % (ref 11.5–15.5)
WBC: 8.2 10*3/uL (ref 4.0–10.5)

## 2022-06-14 MED ORDER — TRAZODONE HCL 50 MG PO TABS
25.0000 mg | ORAL_TABLET | Freq: Every evening | ORAL | 3 refills | Status: DC | PRN
Start: 2022-06-14 — End: 2022-08-16

## 2022-06-14 MED ORDER — KETOCONAZOLE 2 % EX SHAM: 1.0000 | MEDICATED_SHAMPOO | CUTANEOUS | 2 refills | Status: DC

## 2022-06-14 MED ORDER — FLUTICASONE PROPIONATE 50 MCG/ACT NA SUSP
2.0000 | Freq: Every day | NASAL | 3 refills | Status: AC
Start: 2022-06-14 — End: ?

## 2022-06-14 MED ORDER — ALBUTEROL SULFATE HFA 108 (90 BASE) MCG/ACT IN AERS
2.0000 | INHALATION_SPRAY | Freq: Four times a day (QID) | RESPIRATORY_TRACT | 2 refills | Status: DC | PRN
Start: 2022-06-14 — End: 2022-08-16

## 2022-06-14 NOTE — Patient Instructions (Addendum)
Give [place] 2-3 business days to get the results of your labs back.   Keep the diet clean and stay active.  Add some weight resistance exercise to your regimen.   Consider cycling, jogging or the elliptical machine to get a higher intensity workout.   Please get me a copy of your advanced directive form at your convenience.   Let us know if you need anything.

## 2022-06-14 NOTE — Progress Notes (Signed)
Chief Complaint  Patient presents with   Annual Exam     Well Woman Allison Crosby is here for a complete physical.   Her last physical was >1 year ago.  Current diet: in general, an "OK" diet. Current exercise: walking Fatigue out of ordinary? No Seatbelt? Yes Advanced directive? No  Health Maintenance Pap/HPV- Yes Tetanus- Yes HIV screening- Yes Hep C screening- Yes  Past Medical History:  Diagnosis Date   Asthma    Vaginal Pap smear, abnormal 11/26/2019   HSIL  +HPV     History reviewed. No pertinent surgical history.  Medications  Current Outpatient Medications on File Prior to Visit  Medication Sig Dispense Refill   albuterol (VENTOLIN HFA) 108 (90 Base) MCG/ACT inhaler Inhale 2 puffs into the lungs every 6 (six) hours as needed for wheezing. 18 g 2   doxepin (SINEQUAN) 10 MG capsule TAKE 1 CAPSULE (10 MG TOTAL) BY MOUTH AT BEDTIME AS NEEDED. 30 capsule 0   fluconazole (DIFLUCAN) 150 MG tablet TAKE 1 TABLET BY MOUTH EVERY 3 DAYS FOR 3 DOSES 2 tablet 4   fluticasone (FLONASE) 50 MCG/ACT nasal spray Place 2 sprays into both nostrils daily. 48 g 3   montelukast (SINGULAIR) 10 MG tablet Take 1 tablet (10 mg total) by mouth at bedtime. 30 tablet 3   Semaglutide-Weight Management (WEGOVY) 2.4 MG/0.75ML SOAJ INJECT 2.4MG  INTO THE SKIN ONCE WEEKLY 9 mL 2   Allergies Allergies  Allergen Reactions   Metronidazole Rash and Itching    Review of Systems: Constitutional:  no unexpected weight changes Eye:  no recent significant change in vision Ear/Nose/Mouth/Throat:  Ears:  no tinnitus or vertigo and no recent change in hearing Nose/Mouth/Throat:  no complaints of nasal congestion, no sore throat Cardiovascular: no chest pain Respiratory:  no cough and no shortness of breath Gastrointestinal:  no abdominal pain, no change in bowel habits GU:  Female: negative for dysuria or pelvic pain Musculoskeletal/Extremities:  no pain of the joints Integumentary (Skin/Breast):   no abnormal skin lesions reported Neurologic:  no headaches Endocrine:  denies fatigue Hematologic/Lymphatic:  No areas of easy bleeding  Exam BP 112/80 (BP Location: Left Arm, Patient Position: Sitting, Cuff Size: Large)   Pulse 81   Temp 97.9 F (36.6 C) (Oral)   Ht 5\' 5"  (1.651 m)   Wt 268 lb 6 oz (121.7 kg)   SpO2 97%   BMI 44.66 kg/m  General:  well developed, well nourished, in no apparent distress Skin:  no significant moles, warts, or growths Head:  no masses, lesions, or tenderness Eyes:  pupils equal and round, sclera anicteric without injection Ears:  canals without lesions, TMs shiny without retraction, no obvious effusion, no erythema Nose:  nares patent, mucosa normal, and no drainage  Throat/Pharynx:  lips and gingiva without lesion; tongue and uvula midline; non-inflamed pharynx; no exudates or postnasal drainage Neck: neck supple without adenopathy, thyromegaly, or masses Lungs:  clear to auscultation, breath sounds equal bilaterally, no respiratory distress Cardio:  regular rate and rhythm, no bruits, no LE edema Abdomen:  abdomen soft, nontender; bowel sounds normal; no masses or organomegaly Genital: Defer to GYN Musculoskeletal:  symmetrical muscle groups noted without atrophy or deformity Extremities:  no clubbing, cyanosis, or edema, no deformities, no skin discoloration Neuro:  gait normal; deep tendon reflexes normal and symmetric Psych: well oriented with normal range of affect and appropriate judgment/insight  Assessment and Plan  Well adult exam - Plan: CBC, Comprehensive metabolic panel, Lipid panel  Seasonal allergies - Plan: fluticasone (FLONASE) 50 MCG/ACT nasal spray  Mild intermittent asthma without complication - Plan: albuterol (VENTOLIN HFA) 108 (90 Base) MCG/ACT inhaler  Insomnia, unspecified type - Plan: traZODone (DESYREL) 50 MG tablet   Well 37 y.o. female. Counseled on diet and exercise. Challenged her to add wt resistance exercise  and to consider higher intensity cardio.  Advanced directive form provided today.  Other orders as above. Follow up in 6 mo or prn. The patient voiced understanding and agreement to the plan.  Jilda Roche [place], DO 06/14/22 7:18 AM

## 2022-07-12 ENCOUNTER — Ambulatory Visit (INDEPENDENT_AMBULATORY_CARE_PROVIDER_SITE_OTHER): Payer: BC Managed Care – PPO

## 2022-07-12 ENCOUNTER — Other Ambulatory Visit (HOSPITAL_COMMUNITY)
Admission: RE | Admit: 2022-07-12 | Discharge: 2022-07-12 | Disposition: A | Discharge status: Home / Self Care | Payer: BC Managed Care – PPO | Source: Ambulatory Visit | Attending: Family Medicine | Admitting: Family Medicine

## 2022-07-12 VITALS — BP 132/74 | HR 70 | Wt 267.0 lb

## 2022-07-12 DIAGNOSIS — R399 Unspecified symptoms and signs involving the genitourinary system: Secondary | ICD-10-CM

## 2022-07-12 DIAGNOSIS — N898 Other specified noninflammatory disorders of vagina: Secondary | ICD-10-CM

## 2022-07-12 LAB — POCT URINALYSIS DIPSTICK
Bilirubin, UA: NEGATIVE
Blood, UA: NEGATIVE
Glucose, UA: NEGATIVE
Ketones, UA: NEGATIVE
Nitrite, UA: NEGATIVE
Protein, UA: NEGATIVE
Spec Grav, UA: 1.015 (ref 1.010–1.025)
Urobilinogen, UA: 0.2 E.U./dL
pH, UA: 7 (ref 5.0–8.0)

## 2022-07-12 NOTE — Progress Notes (Signed)
SUBJECTIVE:  37 y.o. female complains of white vaginal discharge for 3 day(s). Denies abnormal vaginal bleeding or significant pelvic pain or fever. No UTI symptoms. Denies history of known exposure to STD.  No LMP recorded.  OBJECTIVE:  She appears well, afebrile. Urine dipstick: positive for WBC's.  ASSESSMENT:  Vaginal Discharge  Vaginal Odor   PLAN:  GC, chlamydia, trichomonas, BVAG, CVAG probe sent to lab. Treatment: To be determined once lab results are received ROV prn if symptoms persist or worsen.   Jimia Gentles l Avonne Berkery, CMA

## 2022-07-13 LAB — CERVICOVAGINAL ANCILLARY ONLY
Candida Glabrata: NEGATIVE
Candida Vaginitis: NEGATIVE
Chlamydia: NEGATIVE
Comment: NEGATIVE
Comment: NEGATIVE
Comment: NEGATIVE
Comment: NEGATIVE
Comment: NORMAL
Neisseria Gonorrhea: NEGATIVE
Trichomonas: NEGATIVE

## 2022-07-18 ENCOUNTER — Other Ambulatory Visit: Payer: Self-pay | Admitting: Family Medicine

## 2022-08-16 ENCOUNTER — Ambulatory Visit (INDEPENDENT_AMBULATORY_CARE_PROVIDER_SITE_OTHER): Payer: BC Managed Care – PPO | Admitting: Family Medicine

## 2022-08-16 ENCOUNTER — Encounter: Payer: Self-pay | Admitting: Family Medicine

## 2022-08-16 ENCOUNTER — Other Ambulatory Visit (HOSPITAL_COMMUNITY): Admission: RE | Admit: 2022-08-16 | Payer: BC Managed Care – PPO | Source: Ambulatory Visit | Admitting: Family Medicine

## 2022-08-16 VITALS — BP 113/57 | HR 75 | Wt 263.0 lb

## 2022-08-16 DIAGNOSIS — Z8742 Personal history of other diseases of the female genital tract: Secondary | ICD-10-CM | POA: Insufficient documentation

## 2022-08-16 DIAGNOSIS — Z124 Encounter for screening for malignant neoplasm of cervix: Secondary | ICD-10-CM

## 2022-08-16 NOTE — Progress Notes (Signed)
   Subjective:    Patient ID: Allison Crosby, female    DOB: 1985/11/22, 37 y.o.   MRN: [id number]  HPI  Patient without complaints.  Review of Systems     Objective:   Physical Exam Vitals reviewed. Exam conducted with a chaperone present.  Constitutional:      Appearance: Normal appearance.  Genitourinary:    Labia:        Right: No rash or tenderness.        Left: No rash or tenderness.      Vagina: Normal.     Cervix: Normal. No cervical motion tenderness, friability or erythema.  Skin:    Capillary Refill: Capillary refill takes less than 2 seconds.  Neurological:     General: No focal deficit present.     Mental Status: She is alert.  Psychiatric:        Mood and Affect: Mood normal.        Behavior: Behavior normal.        Thought Content: Thought content normal.        Judgment: Judgment normal.        Assessment & Plan:  1. Screening for cervical cancer - Cytology - PAP( [place])  2. History of abnormal cervical Pap smear HSIL on PAP 2021 and 2022. Colposcopy with CIN1 CIN3 on LEEP with positive endocervical margin. PAP at 6 months negative. PAP today. If negative, will repeat PAP in 1 year.  - Cytology - PAP( [place])

## 2022-08-18 LAB — CYTOLOGY - PAP
Adequacy: ABSENT
Comment: NEGATIVE
Diagnosis: NEGATIVE
High risk HPV: NEGATIVE

## 2022-09-05 ENCOUNTER — Ambulatory Visit (INDEPENDENT_AMBULATORY_CARE_PROVIDER_SITE_OTHER): Payer: BC Managed Care – PPO

## 2022-09-05 ENCOUNTER — Other Ambulatory Visit (HOSPITAL_COMMUNITY)
Admission: RE | Admit: 2022-09-05 | Discharge: 2022-09-05 | Disposition: A | Discharge status: Home / Self Care | Payer: BC Managed Care – PPO | Source: Ambulatory Visit | Attending: Medical | Admitting: Medical

## 2022-09-05 VITALS — BP 116/61 | HR 72 | Wt 257.0 lb

## 2022-09-05 DIAGNOSIS — N898 Other specified noninflammatory disorders of vagina: Secondary | ICD-10-CM | POA: Diagnosis not present

## 2022-09-05 DIAGNOSIS — N760 Acute vaginitis: Secondary | ICD-10-CM

## 2022-09-05 NOTE — Progress Notes (Signed)
SUBJECTIVE:  37 y.o. female complains of yellow vaginal discharge for 2 day(s). Denies abnormal vaginal bleeding or significant pelvic pain or fever. No UTI symptoms. Denies history of known exposure to STD.  Patient's last menstrual period was 08/20/2022 (exact date).  OBJECTIVE:  She appears well, afebrile. Urine dipstick: not done.  ASSESSMENT:  Vaginal Discharge  Vaginal Odor   PLAN:  GC, chlamydia, trichomonas, BVAG, CVAG probe sent to lab. Treatment: To be determined once lab results are received ROV prn if symptoms persist or worsen.   Seleta Hovland l Thaison Kolodziejski, CMA

## 2022-09-07 ENCOUNTER — Telehealth: Payer: Self-pay

## 2022-09-07 DIAGNOSIS — N760 Acute vaginitis: Secondary | ICD-10-CM

## 2022-09-07 DIAGNOSIS — B379 Candidiasis, unspecified: Secondary | ICD-10-CM

## 2022-09-07 MED ORDER — FLUCONAZOLE 150 MG PO TABS
ORAL_TABLET | ORAL | 1 refills | Status: DC
Start: 2022-09-07 — End: 2022-10-03

## 2022-09-07 MED ORDER — CLINDAMYCIN PHOSPHATE 1 % EX GEL
Freq: Two times a day (BID) | CUTANEOUS | 11 refills | Status: DC
Start: 2022-09-07 — End: 2023-06-20

## 2022-09-07 MED ORDER — CLINDAMYCIN HCL 300 MG PO CAPS: 300.0000 mg | ORAL_CAPSULE | Freq: Two times a day (BID) | ORAL | 0 refills | Status: AC

## 2022-09-07 NOTE — Telephone Encounter (Signed)
-----   Message from Madlyn Frankel sent at 09/06/2022  3:53 PM EDT ----- Regarding: Results Patient called with questions about some lab results she got on MyChart.   Best contact number is [phone].

## 2022-09-07 NOTE — Telephone Encounter (Signed)
Returned patient's call. Patient requests medication for BV and yeast. Clindamycin 300 mg BID x 7 days and Diflucan 150 mg was sent to her pharmacy. Understanding was voiced. Daphnie Venturini l [place], CMA

## 2022-09-07 NOTE — Addendum Note (Signed)
Addended by: Marny Lowenstein on: 09/07/2022 09:48 AM   Modules accepted: Orders

## 2022-10-01 ENCOUNTER — Other Ambulatory Visit: Payer: Self-pay | Admitting: Family Medicine

## 2022-10-01 DIAGNOSIS — J302 Other seasonal allergic rhinitis: Secondary | ICD-10-CM

## 2022-10-01 DIAGNOSIS — J4520 Mild intermittent asthma, uncomplicated: Secondary | ICD-10-CM

## 2022-10-03 ENCOUNTER — Ambulatory Visit (INDEPENDENT_AMBULATORY_CARE_PROVIDER_SITE_OTHER): Payer: BC Managed Care – PPO | Admitting: Family Medicine

## 2022-10-03 ENCOUNTER — Encounter: Payer: Self-pay | Admitting: Family Medicine

## 2022-10-03 VITALS — BP 110/78 | HR 78 | Temp 98.2°F | Ht 65.0 in | Wt 258.1 lb

## 2022-10-03 DIAGNOSIS — N3000 Acute cystitis without hematuria: Secondary | ICD-10-CM

## 2022-10-03 LAB — POC URINALSYSI DIPSTICK (AUTOMATED)
Bilirubin, UA: NEGATIVE
Blood, UA: NEGATIVE
Glucose, UA: NEGATIVE
Ketones, UA: POSITIVE
Nitrite, UA: NEGATIVE
Protein, UA: NEGATIVE
Spec Grav, UA: 1.01 (ref 1.010–1.025)
Urobilinogen, UA: 0.2 U/dL
pH, UA: 6 (ref 5.0–8.0)

## 2022-10-03 MED ORDER — FLUCONAZOLE 150 MG PO TABS: ORAL_TABLET | ORAL | 0 refills | Status: DC

## 2022-10-03 MED ORDER — SULFAMETHOXAZOLE-TRIMETHOPRIM 800-160 MG PO TABS: 1.0000 | ORAL_TABLET | Freq: Two times a day (BID) | ORAL | 0 refills | Status: DC

## 2022-10-03 NOTE — Patient Instructions (Signed)
Stay hydrated.   Warning signs/symptoms: Uncontrollable nausea/vomiting, fevers, worsening symptoms despite treatment, confusion.  Give us around 2 business days to get culture back to you.  Let us know if you need anything. 

## 2022-10-03 NOTE — Progress Notes (Signed)
Chief Complaint  Patient presents with   Dysuria   Urinary Frequency    Allison Crosby is a 37 y.o. female here for possible UTI.  Duration: 3 days. Symptoms: Dysuria, urinary frequency, urinary retention, and urgency Denies: hematuria, fever, flank pain, nausea, and vomiting, vaginal discharge Hx of recurrent UTI? No Denies new sexual partners. Took pyridium at home which temporary helped.   Past Medical History:  Diagnosis Date   Asthma    Vaginal Pap smear, abnormal 11/26/2019   HSIL  +HPV     BP 110/78 (BP Location: Left Arm, Patient Position: Sitting, Cuff Size: Large)   Pulse 78   Temp 98.2 F (36.8 C) (Oral)   Ht 5\' 5"  (1.651 m)   Wt 258 lb 2 oz (117.1 kg)   LMP 08/20/2022 (Exact Date)   SpO2 99%   BMI 42.95 kg/m  General: Awake, alert, appears stated age Heart: RRR Lungs: CTAB, normal respiratory effort, no accessory muscle usage Abd: BS+, soft, mild suprapubic ttp, ND, no masses or organomegaly MSK: No CVA tenderness, neg Lloyd's sign Psych: Age appropriate judgment and insight  Acute cystitis without hematuria - Plan: POCT Urinalysis Dipstick (Automated), Urine Culture, sulfamethoxazole-trimethoprim (BACTRIM DS) 800-160 MG tablet, fluconazole (DIFLUCAN) 150 MG tablet  Stay hydrated. Seek immediate care if pt starts to develop fevers, new/worsening symptoms, uncontrollable N/V. F/u prn. The patient voiced understanding and agreement to the plan.  Jilda Roche [place], DO 10/03/22 2:17 PM

## 2022-10-05 ENCOUNTER — Encounter: Payer: Self-pay | Admitting: Family Medicine

## 2022-10-05 LAB — URINE CULTURE
MICRO NUMBER:: 15413328
SPECIMEN QUALITY:: ADEQUATE

## 2022-10-06 ENCOUNTER — Other Ambulatory Visit: Payer: Self-pay | Admitting: Family Medicine

## 2022-10-06 MED ORDER — NITROFURANTOIN MONOHYD MACRO 100 MG PO CAPS: 100.0000 mg | ORAL_CAPSULE | Freq: Two times a day (BID) | ORAL | 0 refills | Status: AC

## 2022-10-11 ENCOUNTER — Other Ambulatory Visit: Payer: Self-pay | Admitting: Family Medicine

## 2022-10-11 DIAGNOSIS — J302 Other seasonal allergic rhinitis: Secondary | ICD-10-CM

## 2022-10-11 DIAGNOSIS — J4520 Mild intermittent asthma, uncomplicated: Secondary | ICD-10-CM

## 2022-10-11 MED ORDER — MONTELUKAST SODIUM 10 MG PO TABS
10.0000 mg | ORAL_TABLET | Freq: Every day | ORAL | 3 refills | Status: DC
Start: 2022-10-11 — End: 2023-12-05

## 2022-10-25 ENCOUNTER — Other Ambulatory Visit: Payer: Self-pay | Admitting: Family Medicine

## 2022-10-25 DIAGNOSIS — N3000 Acute cystitis without hematuria: Secondary | ICD-10-CM

## 2022-12-15 ENCOUNTER — Ambulatory Visit: Payer: BC Managed Care – PPO | Admitting: Family Medicine

## 2022-12-20 ENCOUNTER — Ambulatory Visit (INDEPENDENT_AMBULATORY_CARE_PROVIDER_SITE_OTHER): Payer: BC Managed Care – PPO | Admitting: Family Medicine

## 2022-12-20 DIAGNOSIS — J4520 Mild intermittent asthma, uncomplicated: Secondary | ICD-10-CM

## 2022-12-20 MED ORDER — ALBUTEROL SULFATE HFA 108 (90 BASE) MCG/ACT IN AERS: 2.0000 | INHALATION_SPRAY | Freq: Four times a day (QID) | RESPIRATORY_TRACT | Status: AC | PRN

## 2022-12-20 NOTE — Progress Notes (Signed)
Chief Complaint  Patient presents with   Follow-up    Follow up    Subjective: Patient is a 37 y.o. female here for follow-up.  Patient is currently taking Wegovy 2.4 mg weekly.  She reports compliance and no adverse effects.  She has lost around 80 pounds.  Her initial goal was to get to 250 pounds.  She is unsure if she wishes to lose any more weight.  Diet is much improved.  She is lifting weights and walking for exercise.  Patient has a history of mild intermittent asthma.  She takes montelukast during certain seasons.  She takes albuterol very rarely.  She reports no adverse effects.  No recent steroid use or hospitalization.  Past Medical History:  Diagnosis Date   Asthma    Vaginal Pap smear, abnormal 11/26/2019   HSIL  +HPV    Objective: BP 118/74 (BP Location: Left Arm, Patient Position: Sitting, Cuff Size: Normal)   Pulse 74   Temp 98 F (36.7 C) (Oral)   Resp 16   Ht 5\' 5"  (1.651 m)   Wt 248 lb (112.5 kg)   SpO2 96%   BMI 41.27 kg/m  General: Awake, appears stated age Heart: RRR, no LE edema Lungs: CTAB, no rales, wheezes or rhonchi. No accessory muscle use Psych: Age appropriate judgment and insight, normal affect and mood  Assessment and Plan: Morbid obesity (HCC)  Mild intermittent asthma without complication - Plan: albuterol (VENTOLIN HFA) 108 (90 Base) MCG/ACT inhaler  Chronic, stable.  Continue Wegovy 2.4 mg weekly.  Counseled diet and exercise.  She will let me know if she wishes to reduce the dosage if she wishes to stop her weight loss. Chronic, stable.  Continue albuterol as needed, montelukast 10 mg daily as needed. Follow-up in 6 months for physical. The patient voiced understanding and agreement to the plan.  Jilda Roche [place], DO 12/20/22  9:13 AM

## 2022-12-20 NOTE — Patient Instructions (Signed)
Keep the diet clean and stay active.  Very strong work with your weight loss.   Let me know if you would like to reduce your [facility] dosage.   Let us know if you need anything.

## 2023-01-11 ENCOUNTER — Encounter: Payer: Self-pay | Admitting: Family Medicine

## 2023-01-12 ENCOUNTER — Other Ambulatory Visit: Payer: Self-pay | Admitting: Family Medicine

## 2023-01-12 MED ORDER — WEGOVY 1.7 MG/0.75ML ~~LOC~~ SOAJ: 1.7000 mg | SUBCUTANEOUS | 2 refills | Status: DC

## 2023-05-31 ENCOUNTER — Ambulatory Visit: Payer: Self-pay | Admitting: *Deleted

## 2023-05-31 NOTE — Telephone Encounter (Signed)
 Copied from CRM [id number]. Topic: Clinical - Medical Advice >> May 31, 2023  8:18 AM Pal[place] Bob wrote: Reason for CRM: Patient [place] and notices she has been having sinuses problem. Patient states she didn't take her allergy medicine for a week due to not being needed. Patient would like to know what would Dr.Wendling suggest?

## 2023-05-31 NOTE — Telephone Encounter (Signed)
 3rd attempt to return her call without success.   Left a voicemail to call back.   Per policy I'm forwarding this to  [place].

## 2023-05-31 NOTE — Telephone Encounter (Signed)
 2nd attempt to return her call.  Left voicemail to call back.  Routed to call back basket for further attempts.

## 2023-05-31 NOTE — Telephone Encounter (Signed)
  Chief Complaint: Sinus pain, pressure around eyes. Was out of town and forgot allergy medication. Has started back on it now. Still having symptoms with green discharge.Wants advise form PC, declines OV. Symptoms: Above Frequency: This week Pertinent Negatives: Patient denies fever Disposition: [] ED /[] Urgent Care (no appt availability in office) / [] Appointment(In office/virtual)/ []  [facility]/ [] Home Care/ [x] Refused Recommended Disposition /[] [facility]/ [x]  Follow-up with PCP Additional Notes: Please advise pt. On what else she can take.  Reason for Disposition . [1] Using nasal washes and pain medicine > 24 hours AND [2] sinus pain (around cheekbone or eye) persists  Answer Assessment - Initial Assessment Questions 1. LOCATION: "Where does it hurt?"      Eyes 2. ONSET: "When did the sinus pain start?"  (e.g., hours, days)      This week 3. SEVERITY: "How bad is the pain?"   (Scale 1-10; mild, moderate or severe)   - MILD (1-3): doesn't interfere with normal activities    - MODERATE (4-7): interferes with normal activities (e.g., work or school) or awakens from sleep   - SEVERE (8-10): excruciating pain and patient unable to do any normal activities        5 4. RECURRENT SYMPTOM: "Have you ever had sinus problems before?" If Yes, ask: "When was the last time?" and "What happened that time?"      yes 5. NASAL CONGESTION: "Is the nose blocked?" If Yes, ask: "Can you open it or must you breathe through your mouth?"     no 6. NASAL DISCHARGE: "Do you have discharge from your nose?" If so ask, "What color?"     Green 7. FEVER: "Do you have a fever?" If Yes, ask: "What is it, how was it measured, and when did it start?"      No 8. OTHER SYMPTOMS: "Do you have any other symptoms?" (e.g., sore throat, cough, earache, difficulty breathing)     Scratchy throat 9. PREGNANCY: "Is there any chance you are pregnant?" "When was your last menstrual period?"      No  Protocols used: Sinus Pain or Congestion-A-AH

## 2023-05-31 NOTE — Telephone Encounter (Signed)
 First attempt to return her call.   Left a voicemail to call back.   Any nurse can assist.   Routing to call back basket for further attempts.

## 2023-06-11 ENCOUNTER — Other Ambulatory Visit: Payer: Self-pay | Admitting: Family Medicine

## 2023-06-20 ENCOUNTER — Encounter: Payer: Self-pay | Admitting: Family Medicine

## 2023-06-20 ENCOUNTER — Ambulatory Visit: Admitting: Family Medicine

## 2023-06-20 ENCOUNTER — Other Ambulatory Visit (HOSPITAL_COMMUNITY)
Admission: RE | Admit: 2023-06-20 | Discharge: 2023-06-20 | Disposition: A | Discharge status: Home / Self Care | Source: Ambulatory Visit | Attending: Family Medicine | Admitting: Family Medicine

## 2023-06-20 VITALS — BP 123/66 | HR 73 | Ht 65.0 in | Wt 244.0 lb

## 2023-06-20 DIAGNOSIS — Z01419 Encounter for gynecological examination (general) (routine) without abnormal findings: Secondary | ICD-10-CM | POA: Diagnosis not present

## 2023-06-20 DIAGNOSIS — Z113 Encounter for screening for infections with a predominantly sexual mode of transmission: Secondary | ICD-10-CM | POA: Diagnosis not present

## 2023-06-20 DIAGNOSIS — N898 Other specified noninflammatory disorders of vagina: Secondary | ICD-10-CM | POA: Diagnosis not present

## 2023-06-20 DIAGNOSIS — Z1231 Encounter for screening mammogram for malignant neoplasm of breast: Secondary | ICD-10-CM

## 2023-06-20 MED ORDER — FLUCONAZOLE 150 MG PO TABS: 150.0000 mg | ORAL_TABLET | Freq: Once | ORAL | 3 refills | Status: AC

## 2023-06-20 NOTE — Addendum Note (Signed)
 Addended by: Malka Sea on: 06/20/2023 12:13 PM   Modules accepted: Orders

## 2023-06-20 NOTE — Progress Notes (Signed)
 ANNUAL EXAM Patient name: Allison Crosby MRN [id number]  Date of birth: 08-Jul-1985 Chief Complaint:   Annual Exam  History of Present Illness:   Allison Crosby is a 38 y.o.  G0P0000  female  being seen today for a routine annual exam.  Current complaints: no complaints  Patient's last menstrual period was 05/28/2023.    Last pap 2024. Results were: NILM w/ HRHPV negative. H/O abnormal pap: yes s/p LEEP Last mammogram: n/a.      06/20/2023   11:21 AM 06/14/2022    6:59 AM 05/11/2021    7:37 AM 06/01/2020    9:24 AM 06/01/2020    9:19 AM  Depression screen PHQ 2/9  Decreased Interest 0 0 0 0 0  Down, Depressed, Hopeless 0 0 0 0 0  PHQ - 2 Score 0 0 0 0 0  Altered sleeping 0 0  0   Tired, decreased energy 0 0  0   Change in appetite 0 0  0   Feeling bad or failure about yourself  0 0  0   Trouble concentrating 0 0  0   Moving slowly or fidgety/restless 0 0  0   Suicidal thoughts 0 0  0   PHQ-9 Score 0 0  0   Difficult doing work/chores Not difficult at all Not difficult at all  Not difficult at all         06/20/2023   11:21 AM  GAD 7 : Generalized Anxiety Score  Nervous, Anxious, on Edge 0  Control/stop worrying 0  Worry too much - different things 0  Trouble relaxing 0  Restless 0  Easily annoyed or irritable 0  Afraid - awful might happen 0  Total GAD 7 Score 0  Anxiety Difficulty Not difficult at all     Review of Systems:   Pertinent items are noted in HPI Denies any headaches, blurred vision, fatigue, shortness of breath, chest pain, abdominal pain, abnormal vaginal discharge/itching/odor/irritation, problems with periods, bowel movements, urination, or intercourse unless otherwise stated above. Pertinent History Reviewed:  Reviewed past medical,surgical, social and family history.  Reviewed problem list, medications and allergies. Physical Assessment:   Vitals:   06/20/23 1117  BP: 123/66  Pulse: 73  Weight: 244 lb (110.7 kg)  Height: 5\' 5"   (1.651 m)  Body mass index is 40.6 kg/m.        Physical Examination:   General appearance - well appearing, and in no distress  Mental status - alert, oriented to person, place, and time  Psych:  She has a normal mood and affect  Skin - warm and dry, normal color, no suspicious lesions noted  Chest - effort normal, all lung fields clear to auscultation bilaterally  Heart - normal rate and regular rhythm  Neck:  midline trachea, no thyromegaly or nodules  Breasts - breasts appear normal, no suspicious masses, no skin or nipple changes or axillary nodes  Abdomen - soft, nontender, nondistended, no masses or organomegaly  Pelvic - VULVA: normal appearing vulva with no masses, tenderness or lesions  VAGINA: normal appearing vagina with normal color and discharge, no lesions  CERVIX: normal appearing cervix without discharge or lesions, no CMT  Thin prep pap is done with HR HPV cotesting  UTERUS: uterus is felt to be normal size, shape, consistency and nontender   ADNEXA: No adnexal masses or tenderness noted.  Extremities:  No swelling or varicosities noted  Chaperone present for exam  Assessment & Plan:  1.  Vaginal discharge (Primary) Has been tested in the past and was told that it was yeast.  - Cervicovaginal ancillary only( [place])  2. Well woman exam with routine gynecological exam PAP today.  If normal, return to normal testing - Cytology - PAP  3. Encounter for screening mammogram for malignant neoplasm of breast - MM 3D SCREENING MAMMOGRAM BILATERAL BREAST; Future  4. Routine screening for STI (sexually transmitted infection) - HIV antibody (with reflex) - RPR - Hepatitis C Antibody - Hepatitis B Surface AntiGEN   Labs/procedures today:   Orders Placed This Encounter  Procedures   MM 3D SCREENING MAMMOGRAM BILATERAL BREAST   HIV antibody (with reflex)   RPR   Hepatitis C Antibody   Hepatitis B Surface AntiGEN    Meds: No orders of the defined types were  placed in this encounter.   Follow-up: No follow-ups on file.  Gurney Balthazor J Allison Vohra, DO 06/20/2023 12:03 PM

## 2023-06-21 LAB — CERVICOVAGINAL ANCILLARY ONLY
Bacterial Vaginitis (gardnerella): POSITIVE — AB
Candida Glabrata: NEGATIVE
Candida Vaginitis: NEGATIVE
Comment: NEGATIVE
Comment: NEGATIVE
Comment: NEGATIVE

## 2023-06-22 ENCOUNTER — Ambulatory Visit: Payer: Self-pay | Admitting: Family Medicine

## 2023-06-22 MED ORDER — CLINDAMYCIN HCL 300 MG PO CAPS: 300.0000 mg | ORAL_CAPSULE | Freq: Two times a day (BID) | ORAL | 0 refills | Status: DC

## 2023-06-27 ENCOUNTER — Ambulatory Visit

## 2023-06-27 ENCOUNTER — Ambulatory Visit: Payer: Self-pay | Admitting: Family Medicine

## 2023-06-27 ENCOUNTER — Other Ambulatory Visit: Payer: Self-pay

## 2023-06-27 ENCOUNTER — Encounter: Payer: Self-pay | Admitting: Family Medicine

## 2023-06-27 ENCOUNTER — Ambulatory Visit (INDEPENDENT_AMBULATORY_CARE_PROVIDER_SITE_OTHER): Payer: BC Managed Care – PPO | Admitting: Family Medicine

## 2023-06-27 VITALS — BP 120/72 | HR 82 | Temp 97.9°F | Resp 16 | Ht 65.0 in | Wt 246.0 lb

## 2023-06-27 DIAGNOSIS — Z0000 Encounter for general adult medical examination without abnormal findings: Secondary | ICD-10-CM

## 2023-06-27 DIAGNOSIS — D649 Anemia, unspecified: Secondary | ICD-10-CM

## 2023-06-27 LAB — COMPREHENSIVE METABOLIC PANEL WITH GFR
ALT: 9 U/L (ref 0–35)
AST: 10 U/L (ref 0–37)
Albumin: 3.9 g/dL (ref 3.5–5.2)
Alkaline Phosphatase: 56 U/L (ref 39–117)
BUN: 12 mg/dL (ref 6–23)
CO2: 29 meq/L (ref 19–32)
Calcium: 9.2 mg/dL (ref 8.4–10.5)
Chloride: 105 meq/L (ref 96–112)
Creatinine, Ser: 0.91 mg/dL (ref 0.40–1.20)
GFR: 79.03 mL/min (ref >60.00)
Glucose, Bld: 82 mg/dL (ref 70–99)
Potassium: 4.1 meq/L (ref 3.5–5.1)
Sodium: 139 meq/L (ref 135–145)
Total Bilirubin: 0.4 mg/dL (ref 0.2–1.2)
Total Protein: 6.6 g/dL (ref 6.0–8.3)

## 2023-06-27 LAB — CBC
HCT: 36.1 % (ref 36.0–46.0)
Hemoglobin: 11.9 g/dL — ABNORMAL LOW (ref 12.0–15.0)
MCHC: 33.1 g/dL (ref 30.0–36.0)
MCV: 86.1 fl (ref 78.0–100.0)
Platelets: 403 10*3/uL — ABNORMAL HIGH (ref 150.0–400.0)
RBC: 4.19 Mil/uL (ref 3.87–5.11)
RDW: 13 % (ref 11.5–15.5)
WBC: 6.3 10*3/uL (ref 4.0–10.5)

## 2023-06-27 LAB — LIPID PANEL
Cholesterol: 146 mg/dL (ref 0–200)
HDL: 45.8 mg/dL (ref >39.00)
LDL Cholesterol: 87 mg/dL (ref 0–99)
NonHDL: 100.28
Total CHOL/HDL Ratio: 3
Triglycerides: 66 mg/dL (ref 0.0–149.0)
VLDL: 13.2 mg/dL (ref 0.0–40.0)

## 2023-06-27 NOTE — Patient Instructions (Signed)
 Give [place] 2-3 business days to get the results of your labs back.   Keep the diet clean and stay active.  Please get me a copy of your advanced directive form at your convenience.   Let us know if you need anything.

## 2023-06-27 NOTE — Progress Notes (Signed)
 Chief Complaint  Patient presents with   Annual Exam    CPE     Well Woman Allison Crosby is here for a complete physical.   Her last physical was >1 year ago.  Current diet: in general, a "healthy" diet. Current exercise: lifting wts, walking. Weight is stable after losing 90 lbs and she denies fatigue out of ordinary. Patient's last menstrual period was 05/28/2023.  Seatbelt? Yes Advanced directive? No  Health Maintenance Pap/HPV- Yes Mammogram- scheduled Tetanus- Yes Hep C screening- Yes HIV screening- Yes  Past Medical History:  Diagnosis Date   Asthma    Vaginal Pap smear, abnormal 11/26/2019   HSIL  +HPV     History reviewed. No pertinent surgical history.  Medications  Current Outpatient Medications on File Prior to Visit  Medication Sig Dispense Refill   albuterol  (VENTOLIN  HFA) 108 (90 Base) MCG/ACT inhaler Inhale 2 puffs into the lungs every 6 (six) hours as needed for wheezing.     clindamycin  (CLEOCIN ) 300 MG capsule Take 1 capsule (300 mg total) by mouth 2 (two) times daily. For seven days 14 capsule 0   fluticasone  (FLONASE ) 50 MCG/ACT nasal spray Place 2 sprays into both nostrils daily. 48 g 3   montelukast  (SINGULAIR ) 10 MG tablet Take 1 tablet (10 mg total) by mouth at bedtime. 90 tablet 3   Semaglutide -Weight Management (WEGOVY ) 1.7 MG/0.75ML SOAJ Inject 1.7 mg into the skin once a week. 9 mL 2   No current facility-administered medications on file prior to visit.     Allergies Allergies  Allergen Reactions   Metronidazole Rash and Itching    Review of Systems: Constitutional:  no unexpected weight changes Eye:  no recent significant change in vision Ear/Nose/Mouth/Throat:  Ears:  no recent change in hearing Nose/Mouth/Throat:  no complaints of nasal congestion, no sore throat Cardiovascular: no chest pain Respiratory:  no shortness of breath Gastrointestinal:  no abdominal pain, no change in bowel habits GU:  Female: negative for dysuria  or pelvic pain Musculoskeletal/Extremities:  no pain of the joints Integumentary (Skin/Breast):  no abnormal skin lesions reported Neurologic:  no headaches Endocrine:  denies fatigue Hematologic/Lymphatic:  No areas of easy bleeding  Exam BP 120/72 (BP Location: Left Arm, Patient Position: Sitting)   Pulse 82   Temp 97.9 F (36.6 C) (Oral)   Resp 16   Ht 5\' 5"  (1.651 m)   Wt 246 lb (111.6 kg)   LMP 05/28/2023   SpO2 97%   BMI 40.94 kg/m  General:  well developed, well nourished, in no apparent distress Skin:  no significant moles, warts, or growths Head:  no masses, lesions, or tenderness Eyes:  pupils equal and round, sclera anicteric without injection Ears:  canals without lesions, TMs shiny without retraction, no obvious effusion, no erythema Nose:  nares patent, mucosa normal, and no drainage Throat/Pharynx:  lips and gingiva without lesion; tongue and uvula midline; non-inflamed pharynx; no exudates or postnasal drainage Neck: neck supple without adenopathy, thyromegaly, or masses Lungs:  clear to auscultation, breath sounds equal bilaterally, no respiratory distress Cardio:  regular rate and rhythm, no LE edema Abdomen:  abdomen soft, nontender; bowel sounds normal; no masses or organomegaly Genital: Defer to GYN Musculoskeletal:  symmetrical muscle groups noted without atrophy or deformity Extremities:  no clubbing, cyanosis, or edema, no deformities, no skin discoloration Neuro:  gait normal; deep tendon reflexes normal and symmetric Psych: well oriented with normal range of affect and appropriate judgment/insight  Assessment and Plan  Well adult exam - Plan: CBC, Comprehensive metabolic panel with GFR, Lipid panel   Well 38 y.o. female. Counseled on diet and exercise. Advanced directive form provided today.  PCV20 politely declined.  Mammogram scheduled.  Other orders as above. Follow up in 6 mo. The patient voiced understanding and agreement to the  plan.  Shellie Dials Conway, DO 06/27/23 7:18 AM

## 2023-06-28 ENCOUNTER — Ambulatory Visit: Payer: Self-pay | Admitting: Family Medicine

## 2023-06-28 LAB — CYTOLOGY - PAP
Chlamydia: NEGATIVE
Comment: NEGATIVE
Comment: NEGATIVE
Comment: NEGATIVE
Comment: NORMAL
Diagnosis: NEGATIVE
High risk HPV: NEGATIVE
Neisseria Gonorrhea: NEGATIVE
Trichomonas: NEGATIVE

## 2023-06-28 LAB — IRON,TIBC AND FERRITIN PANEL
%SAT: 17 % (ref 16–45)
Ferritin: 54 ng/mL (ref 16–154)
Iron: 49 ug/dL (ref 40–190)
TIBC: 282 ug/dL (ref 250–450)

## 2023-07-12 ENCOUNTER — Ambulatory Visit

## 2023-07-12 ENCOUNTER — Other Ambulatory Visit (HOSPITAL_COMMUNITY)
Admission: RE | Admit: 2023-07-12 | Discharge: 2023-07-12 | Disposition: A | Discharge status: Home / Self Care | Source: Ambulatory Visit | Attending: Obstetrics and Gynecology | Admitting: Obstetrics and Gynecology

## 2023-07-12 ENCOUNTER — Ambulatory Visit (HOSPITAL_BASED_OUTPATIENT_CLINIC_OR_DEPARTMENT_OTHER)
Admission: RE | Admit: 2023-07-12 | Discharge: 2023-07-12 | Disposition: A | Discharge status: Home / Self Care | Source: Ambulatory Visit | Attending: Family Medicine | Admitting: Family Medicine

## 2023-07-12 ENCOUNTER — Encounter (HOSPITAL_BASED_OUTPATIENT_CLINIC_OR_DEPARTMENT_OTHER): Payer: Self-pay

## 2023-07-12 VITALS — BP 130/75 | HR 92 | Ht 65.0 in | Wt 245.0 lb

## 2023-07-12 DIAGNOSIS — Z1231 Encounter for screening mammogram for malignant neoplasm of breast: Secondary | ICD-10-CM | POA: Insufficient documentation

## 2023-07-12 DIAGNOSIS — Z113 Encounter for screening for infections with a predominantly sexual mode of transmission: Secondary | ICD-10-CM | POA: Diagnosis not present

## 2023-07-12 DIAGNOSIS — N898 Other specified noninflammatory disorders of vagina: Secondary | ICD-10-CM

## 2023-07-12 NOTE — Progress Notes (Signed)
 SUBJECTIVE:  38 y.o. female who desires a STI screen. Denies abnormal vaginal discharge, bleeding or significant pelvic pain. No UTI symptoms. Denies history of known exposure to STD.  Patient's last menstrual period was 06/26/2023 (exact date).  OBJECTIVE:  She appears well.   ASSESSMENT:  STI Screen   PLAN:  Pt offered STI blood screening-requested GC, chlamydia, and trichomonas probe sent to lab.  Treatment: To be determined once lab results are received.  Pt follow up as needed.   [facility] [employer]

## 2023-07-16 ENCOUNTER — Encounter: Payer: Self-pay | Admitting: Family Medicine

## 2023-07-16 ENCOUNTER — Ambulatory Visit: Payer: Self-pay | Admitting: Obstetrics and Gynecology

## 2023-07-16 DIAGNOSIS — B9689 Other specified bacterial agents as the cause of diseases classified elsewhere: Secondary | ICD-10-CM

## 2023-07-16 LAB — CERVICOVAGINAL ANCILLARY ONLY
Bacterial Vaginitis (gardnerella): POSITIVE — AB
Candida Glabrata: NEGATIVE
Candida Vaginitis: NEGATIVE
Chlamydia: NEGATIVE
Comment: NEGATIVE
Comment: NEGATIVE
Comment: NEGATIVE
Comment: NEGATIVE
Comment: NEGATIVE
Comment: NORMAL
Neisseria Gonorrhea: NEGATIVE
Trichomonas: NEGATIVE

## 2023-07-16 MED ORDER — CLINDAMYCIN PHOSPHATE 2 % VA CREA: 1.0000 | TOPICAL_CREAM | Freq: Every day | VAGINAL | 0 refills | Status: AC

## 2023-07-17 ENCOUNTER — Other Ambulatory Visit: Payer: Self-pay | Admitting: Family Medicine

## 2023-07-18 ENCOUNTER — Other Ambulatory Visit: Payer: Self-pay

## 2023-07-18 DIAGNOSIS — B9689 Other specified bacterial agents as the cause of diseases classified elsewhere: Secondary | ICD-10-CM

## 2023-07-18 MED ORDER — CLINDAMYCIN HCL 300 MG PO CAPS: 300.0000 mg | ORAL_CAPSULE | Freq: Two times a day (BID) | ORAL | 0 refills | Status: AC

## 2023-08-09 ENCOUNTER — Ambulatory Visit (INDEPENDENT_AMBULATORY_CARE_PROVIDER_SITE_OTHER): Admitting: Medical

## 2023-08-09 VITALS — BP 120/76 | Temp 98.2°F | Ht 65.0 in | Wt 243.0 lb

## 2023-08-09 DIAGNOSIS — R102 Pelvic and perineal pain: Secondary | ICD-10-CM | POA: Diagnosis not present

## 2023-08-09 DIAGNOSIS — R350 Frequency of micturition: Secondary | ICD-10-CM

## 2023-08-09 DIAGNOSIS — R8290 Unspecified abnormal findings in urine: Secondary | ICD-10-CM | POA: Diagnosis not present

## 2023-08-09 LAB — POCT URINALYSIS DIPSTICK
Appearance: NORMAL
Bilirubin, UA: NEGATIVE
Blood, UA: NEGATIVE
Glucose, UA: NEGATIVE
Ketones, UA: NEGATIVE
Nitrite, UA: NEGATIVE
Protein, UA: NEGATIVE
Spec Grav, UA: 1.01 (ref 1.010–1.025)
Urobilinogen, UA: 0.2 U/dL
pH, UA: 7 (ref 5.0–8.0)

## 2023-08-09 MED ORDER — CEPHALEXIN 500 MG PO CAPS
500.0000 mg | ORAL_CAPSULE | Freq: Two times a day (BID) | ORAL | 0 refills | Status: AC
Start: 2023-08-09 — End: ?

## 2023-08-09 NOTE — Patient Instructions (Addendum)
 Recurrent Urinary Tract Infections (UTIs) Recurrent UTIs with E. Coli. Current urinalysis shows small infection finding cells, nitrites negative. Urine culture sent for confirmation and organism identification.  - Prescribe Keflex  500 mg twice daily for 7 days. - Send urine for culture to identify potential causative organism. - Adjust antibiotic treatment based on culture and sensitivity results if necessary. - Follow up based on clinical response and culture results

## 2023-08-09 NOTE — Progress Notes (Signed)
 Subjective:    Patient ID: Allison Crosby, female    DOB: 1985/04/27, 38 y.o.   MRN: [id number]  HPI  The patient presents with urinary symptoms and a history of recurrent urinary tract infections.  They experience a sensation of incomplete bladder emptying, pressure and frequent urination, which began last night, described as 'there's something still there' after urination on monday. This causes discomfort and difficulty sleeping. Frequent urination is also noted.  No dysuria, back pain, fevers, chills, or sweats. They have a history of urinary tract infections with bacterial cultures positive  in 2022, 2021, and 2019.  They are allergic to Flagyl.     Review of Systems  Constitutional:  Negative for chills, fatigue and fever.  Respiratory:  Negative for chest tightness, shortness of breath and wheezing.   Cardiovascular:  Negative for chest pain and palpitations.  Gastrointestinal:  Negative for abdominal pain, nausea and vomiting.  Genitourinary:  Negative for flank pain, frequency, hematuria and urgency.       Suprapubic pressure  Musculoskeletal:  Negative for back pain.  Skin:  Negative for rash.  Neurological:  Negative for seizures, facial asymmetry and weakness.  Hematological:  Negative for adenopathy. Does not bruise/bleed easily.  Psychiatric/Behavioral:  Negative for behavioral problems, decreased concentration and dysphoric mood.    Past Medical History:  Diagnosis Date   Asthma    Vaginal Pap smear, abnormal 11/26/2019   HSIL  +HPV     Social History   Socioeconomic History   Marital status: Single    Spouse name: Not on file   Number of children: Not on file   Years of education: Not on file   Highest education level: Bachelor's degree (e.g., BA, AB, BS)  Occupational History   Not on file  Tobacco Use   Smoking status: Never   Smokeless tobacco: Never  Vaping Use   Vaping status: Never Used  Substance and Sexual Activity   Alcohol use: Yes     Comment: Social, 2x week   Drug use: No   Sexual activity: Yes    Birth control/protection: Condom  Other Topics Concern   Not on file  Social History Narrative   Not on file   Social Drivers of Health   Financial Resource Strain: Low Risk  (08/09/2023)   Overall Financial Resource Strain (CARDIA)    Difficulty of Paying Living Expenses: Not very hard  Food Insecurity: No Food Insecurity (08/09/2023)   Hunger Vital Sign    Worried About Running Out of Food in the Last Year: Never true    Ran Out of Food in the Last Year: Never true  Transportation Needs: No Transportation Needs (08/09/2023)   PRAPARE - Administrator, Civil Service (Medical): No    Lack of Transportation (Non-Medical): No  Physical Activity: Insufficiently Active (08/09/2023)   Exercise Vital Sign    Days of Exercise per Week: 3 days    Minutes of Exercise per Session: 30 min  Stress: No Stress Concern Present (08/09/2023)   [employer] of Occupational Health - Occupational Stress Questionnaire    Feeling of Stress: Not at all  Social Connections: Moderately Isolated (08/09/2023)   Social Connection and Isolation Panel    Frequency of Communication with Friends and Family: More than three times a week    Frequency of Social Gatherings with Friends and Family: Once a week    Attends Religious Services: More than 4 times per year    Active Member of [employer]  or Organizations: No    Attends Banker Meetings: Not on file    Marital Status: Never married  Intimate Partner Violence: Not on file    History reviewed. No pertinent surgical history.  Family History  Problem Relation Age of Onset   Mental illness Maternal Aunt    Arthritis Maternal Grandfather    Cancer Maternal Grandfather        Lung    Allergies  Allergen Reactions   Metronidazole Rash and Itching    Current Outpatient Medications on File Prior to Visit  Medication Sig Dispense Refill   albuterol  (VENTOLIN  HFA)  108 (90 Base) MCG/ACT inhaler Inhale 2 puffs into the lungs every 6 (six) hours as needed for wheezing.     fluticasone  (FLONASE ) 50 MCG/ACT nasal spray Place 2 sprays into both nostrils daily. 48 g 3   montelukast  (SINGULAIR ) 10 MG tablet Take 1 tablet (10 mg total) by mouth at bedtime. 90 tablet 3   Semaglutide -Weight Management (WEGOVY ) 1.7 MG/0.75ML SOAJ Inject 1.7 mg into the skin once a week. 9 mL 2   No current facility-administered medications on file prior to visit.    BP 120/76   Temp 98.2 F (36.8 C)   Ht 5' 5 (1.651 m)   Wt 243 lb (110.2 kg)   LMP 06/26/2023 (Exact Date)   BMI 40.44 kg/m        Objective:   Physical Exam  General- No acute distress. Pleasant patient. Neck- Full range of motion, no jvd Lungs- Clear, even and unlabored. Heart- regular rate and rhythm. Neurologic- CNII- XII grossly intact.  Abd- faint suprapubic tenderness. +bs. No rebound or guarding. Back- no cva tenderness.       Assessment & Plan:   Patient Instructions  Recurrent Urinary Tract Infections (UTIs) Recurrent UTIs with E. Coli. Current urinalysis shows small infection finding cells, nitrites negative. Urine culture sent for confirmation and organism identification.  - Prescribe Keflex  500 mg twice daily for 7 days. - Send urine for culture to identify potential causative organism. - Adjust antibiotic treatment based on culture and sensitivity results if necessary. - Follow up based on clinical response and culture results   [place] Maxwell, PA-C

## 2023-08-11 ENCOUNTER — Ambulatory Visit: Payer: Self-pay | Admitting: Medical

## 2023-08-12 LAB — URINE CULTURE
MICRO NUMBER:: 16682237
SPECIMEN QUALITY:: ADEQUATE

## 2023-10-09 ENCOUNTER — Other Ambulatory Visit: Payer: Self-pay | Admitting: Family Medicine

## 2023-10-15 ENCOUNTER — Encounter: Payer: Self-pay | Admitting: Family Medicine

## 2023-12-05 ENCOUNTER — Other Ambulatory Visit: Payer: Self-pay | Admitting: Family Medicine

## 2023-12-05 DIAGNOSIS — J4520 Mild intermittent asthma, uncomplicated: Secondary | ICD-10-CM

## 2023-12-05 DIAGNOSIS — J302 Other seasonal allergic rhinitis: Secondary | ICD-10-CM
